# Patient Record
Sex: Male | Born: 1935 | Race: White | Hispanic: No | Marital: Married | Smoking: Former smoker
Health system: Southern US, Community
[De-identification: ages and names within clinical notes are randomized; demographics above are authoritative.]

## PROBLEM LIST (undated history)

## (undated) DIAGNOSIS — I509 Heart failure, unspecified: Secondary | ICD-10-CM

## (undated) DIAGNOSIS — I10 Essential (primary) hypertension: Secondary | ICD-10-CM

## (undated) HISTORY — PX: CERVICAL SPINE SURGERY: SHX589

---

## 1997-11-24 ENCOUNTER — Ambulatory Visit (HOSPITAL_COMMUNITY): Admission: RE | Admit: 1997-11-24 | Discharge: 1997-11-24 | Payer: Self-pay | Admitting: *Deleted

## 1997-11-28 ENCOUNTER — Ambulatory Visit (HOSPITAL_COMMUNITY): Admission: RE | Admit: 1997-11-28 | Discharge: 1997-11-28 | Payer: Self-pay | Admitting: *Deleted

## 1997-12-10 ENCOUNTER — Ambulatory Visit (HOSPITAL_COMMUNITY): Admission: RE | Admit: 1997-12-10 | Discharge: 1997-12-11 | Payer: Self-pay | Admitting: *Deleted

## 1997-12-17 ENCOUNTER — Ambulatory Visit (HOSPITAL_COMMUNITY): Admission: RE | Admit: 1997-12-17 | Discharge: 1997-12-17 | Payer: Self-pay | Admitting: *Deleted

## 1998-01-14 ENCOUNTER — Ambulatory Visit (HOSPITAL_COMMUNITY): Admission: RE | Admit: 1998-01-14 | Discharge: 1998-01-14 | Payer: Self-pay | Admitting: *Deleted

## 1998-02-10 ENCOUNTER — Encounter: Payer: Self-pay | Admitting: Emergency Medicine

## 1998-02-10 ENCOUNTER — Emergency Department (HOSPITAL_COMMUNITY): Admission: EM | Admit: 1998-02-10 | Discharge: 1998-02-10 | Payer: Self-pay | Admitting: Emergency Medicine

## 1998-08-21 ENCOUNTER — Ambulatory Visit (HOSPITAL_COMMUNITY): Admission: RE | Admit: 1998-08-21 | Discharge: 1998-08-21 | Payer: Self-pay | Admitting: *Deleted

## 1998-08-24 ENCOUNTER — Ambulatory Visit (HOSPITAL_COMMUNITY): Admission: RE | Admit: 1998-08-24 | Discharge: 1998-08-24 | Payer: Self-pay | Admitting: *Deleted

## 1998-08-24 ENCOUNTER — Encounter: Payer: Self-pay | Admitting: *Deleted

## 1998-09-19 ENCOUNTER — Ambulatory Visit (HOSPITAL_COMMUNITY): Admission: RE | Admit: 1998-09-19 | Discharge: 1998-09-19 | Payer: Self-pay | Admitting: Family Medicine

## 1998-09-19 ENCOUNTER — Encounter: Payer: Self-pay | Admitting: Family Medicine

## 1998-09-29 ENCOUNTER — Emergency Department (HOSPITAL_COMMUNITY): Admission: EM | Admit: 1998-09-29 | Discharge: 1998-09-30 | Payer: Self-pay | Admitting: Emergency Medicine

## 1998-09-29 ENCOUNTER — Encounter: Payer: Self-pay | Admitting: Emergency Medicine

## 1999-01-13 ENCOUNTER — Encounter: Payer: Self-pay | Admitting: *Deleted

## 1999-01-13 ENCOUNTER — Ambulatory Visit (HOSPITAL_COMMUNITY): Admission: RE | Admit: 1999-01-13 | Discharge: 1999-01-13 | Payer: Self-pay | Admitting: *Deleted

## 1999-01-20 ENCOUNTER — Ambulatory Visit (HOSPITAL_COMMUNITY): Admission: RE | Admit: 1999-01-20 | Discharge: 1999-01-20 | Payer: Self-pay | Admitting: *Deleted

## 1999-01-20 ENCOUNTER — Encounter: Payer: Self-pay | Admitting: *Deleted

## 1999-01-25 ENCOUNTER — Encounter: Payer: Self-pay | Admitting: *Deleted

## 1999-01-25 ENCOUNTER — Ambulatory Visit (HOSPITAL_COMMUNITY): Admission: RE | Admit: 1999-01-25 | Discharge: 1999-01-25 | Payer: Self-pay | Admitting: *Deleted

## 1999-01-29 ENCOUNTER — Encounter: Payer: Self-pay | Admitting: *Deleted

## 1999-01-29 ENCOUNTER — Ambulatory Visit (HOSPITAL_COMMUNITY): Admission: RE | Admit: 1999-01-29 | Discharge: 1999-01-29 | Payer: Self-pay | Admitting: *Deleted

## 1999-01-31 ENCOUNTER — Inpatient Hospital Stay (HOSPITAL_COMMUNITY): Admission: EM | Admit: 1999-01-31 | Discharge: 1999-02-05 | Payer: Self-pay | Admitting: Psychiatry

## 1999-05-08 ENCOUNTER — Emergency Department (HOSPITAL_COMMUNITY): Admission: EM | Admit: 1999-05-08 | Discharge: 1999-05-08 | Payer: Self-pay | Admitting: Emergency Medicine

## 1999-07-15 ENCOUNTER — Emergency Department (HOSPITAL_COMMUNITY): Admission: EM | Admit: 1999-07-15 | Discharge: 1999-07-15 | Payer: Self-pay

## 2000-07-03 ENCOUNTER — Emergency Department (HOSPITAL_COMMUNITY): Admission: EM | Admit: 2000-07-03 | Discharge: 2000-07-03 | Payer: Self-pay | Admitting: Emergency Medicine

## 2000-07-03 ENCOUNTER — Encounter: Payer: Self-pay | Admitting: Emergency Medicine

## 2001-05-21 ENCOUNTER — Encounter: Payer: Self-pay | Admitting: Urology

## 2001-05-21 ENCOUNTER — Encounter: Admission: RE | Admit: 2001-05-21 | Discharge: 2001-05-21 | Payer: Self-pay | Admitting: Urology

## 2001-07-04 ENCOUNTER — Encounter: Payer: Self-pay | Admitting: Surgery

## 2001-07-04 ENCOUNTER — Encounter (INDEPENDENT_AMBULATORY_CARE_PROVIDER_SITE_OTHER): Payer: Self-pay | Admitting: Specialist

## 2001-07-04 ENCOUNTER — Ambulatory Visit (HOSPITAL_COMMUNITY): Admission: RE | Admit: 2001-07-04 | Discharge: 2001-07-04 | Payer: Self-pay | Admitting: Surgery

## 2001-07-10 ENCOUNTER — Encounter: Payer: Self-pay | Admitting: Emergency Medicine

## 2001-07-10 ENCOUNTER — Inpatient Hospital Stay (HOSPITAL_COMMUNITY): Admission: EM | Admit: 2001-07-10 | Discharge: 2001-07-13 | Payer: Self-pay | Admitting: Emergency Medicine

## 2001-07-12 ENCOUNTER — Encounter: Payer: Self-pay | Admitting: Surgery

## 2001-09-19 ENCOUNTER — Emergency Department (HOSPITAL_COMMUNITY): Admission: EM | Admit: 2001-09-19 | Discharge: 2001-09-19 | Payer: Self-pay | Admitting: Emergency Medicine

## 2003-09-03 ENCOUNTER — Ambulatory Visit (HOSPITAL_COMMUNITY): Admission: RE | Admit: 2003-09-03 | Discharge: 2003-09-03 | Payer: Self-pay | Admitting: Internal Medicine

## 2004-07-12 ENCOUNTER — Emergency Department (HOSPITAL_COMMUNITY): Admission: EM | Admit: 2004-07-12 | Discharge: 2004-07-12 | Payer: Self-pay | Admitting: Emergency Medicine

## 2004-10-15 ENCOUNTER — Ambulatory Visit: Payer: Self-pay | Admitting: Internal Medicine

## 2005-12-16 ENCOUNTER — Emergency Department (HOSPITAL_COMMUNITY): Admission: EM | Admit: 2005-12-16 | Discharge: 2005-12-16 | Payer: Self-pay | Admitting: Emergency Medicine

## 2006-08-30 ENCOUNTER — Ambulatory Visit: Payer: Self-pay | Admitting: Internal Medicine

## 2008-02-07 ENCOUNTER — Observation Stay (HOSPITAL_COMMUNITY): Admission: EM | Admit: 2008-02-07 | Discharge: 2008-02-13 | Payer: Self-pay | Admitting: Emergency Medicine

## 2008-02-09 ENCOUNTER — Ambulatory Visit: Payer: Self-pay | Admitting: *Deleted

## 2009-08-17 ENCOUNTER — Emergency Department (HOSPITAL_COMMUNITY): Admission: EM | Admit: 2009-08-17 | Discharge: 2009-08-17 | Payer: Self-pay | Admitting: Emergency Medicine

## 2009-09-04 ENCOUNTER — Emergency Department (HOSPITAL_COMMUNITY): Admission: EM | Admit: 2009-09-04 | Discharge: 2009-09-04 | Payer: Self-pay | Admitting: Emergency Medicine

## 2009-11-20 ENCOUNTER — Emergency Department (HOSPITAL_COMMUNITY): Admission: EM | Admit: 2009-11-20 | Discharge: 2009-11-20 | Payer: Self-pay | Admitting: Emergency Medicine

## 2010-01-08 ENCOUNTER — Inpatient Hospital Stay (HOSPITAL_COMMUNITY)
Admission: EM | Admit: 2010-01-08 | Discharge: 2010-01-11 | Payer: Self-pay | Source: Home / Self Care | Admitting: Emergency Medicine

## 2010-03-31 ENCOUNTER — Emergency Department (HOSPITAL_COMMUNITY)
Admission: EM | Admit: 2010-03-31 | Discharge: 2010-04-01 | Payer: Self-pay | Source: Home / Self Care | Admitting: Emergency Medicine

## 2010-08-18 LAB — URINE CULTURE
Colony Count: >=100000
Culture  Setup Time: 201110270114

## 2010-08-18 LAB — URINE MICROSCOPIC-ADD ON

## 2010-08-18 LAB — COMPREHENSIVE METABOLIC PANEL
ALT: 22 U/L (ref 0–53)
Albumin: 3.6 g/dL (ref 3.5–5.2)
Alkaline Phosphatase: 107 U/L (ref 39–117)
CO2: 21 mEq/L (ref 19–32)
Calcium: 9.2 mg/dL (ref 8.4–10.5)
Chloride: 107 mEq/L (ref 96–112)
GFR calc Af Amer: >60 mL/min (ref >60)
Sodium: 138 mEq/L (ref 135–145)
Total Bilirubin: 1.2 mg/dL (ref 0.3–1.2)

## 2010-08-18 LAB — DIFFERENTIAL
Basophils Relative: 0 % (ref 0–1)
Eosinophils Absolute: 0.1 10*3/uL (ref 0.0–0.7)
Eosinophils Relative: 1 % (ref 0–5)
Lymphocytes Relative: 9 % — ABNORMAL LOW (ref 12–46)
Neutro Abs: 8.2 10*3/uL — ABNORMAL HIGH (ref 1.7–7.7)
Neutrophils Relative %: 84 % — ABNORMAL HIGH (ref 43–77)

## 2010-08-18 LAB — CBC
HCT: 44.4 % (ref 39.0–52.0)
Hemoglobin: 15.4 g/dL (ref 13.0–17.0)
MCHC: 34.7 g/dL (ref 30.0–36.0)
Platelets: 350 10*3/uL (ref 150–400)
WBC: 9.8 10*3/uL (ref 4.0–10.5)

## 2010-08-18 LAB — URINALYSIS, ROUTINE W REFLEX MICROSCOPIC
Glucose, UA: NEGATIVE mg/dL
Protein, ur: NEGATIVE mg/dL
pH: 6 (ref 5.0–8.0)

## 2010-08-18 LAB — RAPID URINE DRUG SCREEN, HOSP PERFORMED: Benzodiazepines: NOT DETECTED

## 2010-08-20 LAB — URINALYSIS, ROUTINE W REFLEX MICROSCOPIC
Bilirubin Urine: NEGATIVE
Ketones, ur: NEGATIVE mg/dL
Nitrite: NEGATIVE
Protein, ur: NEGATIVE mg/dL
Specific Gravity, Urine: 1.004 — ABNORMAL LOW (ref 1.005–1.030)
Urobilinogen, UA: 0.2 mg/dL (ref 0.0–1.0)

## 2010-08-20 LAB — URINE CULTURE

## 2010-08-20 LAB — DIFFERENTIAL
Basophils Absolute: 0 10*3/uL (ref 0.0–0.1)
Eosinophils Absolute: 0.1 10*3/uL (ref 0.0–0.7)
Eosinophils Relative: 2 % (ref 0–5)
Lymphocytes Relative: 18 % (ref 12–46)
Monocytes Absolute: 0.5 10*3/uL (ref 0.1–1.0)
Monocytes Absolute: 0.9 10*3/uL (ref 0.1–1.0)
Monocytes Relative: 7 % (ref 3–12)
Neutrophils Relative %: 85 % — ABNORMAL HIGH (ref 43–77)

## 2010-08-20 LAB — COMPREHENSIVE METABOLIC PANEL
ALT: 9 U/L (ref 0–53)
AST: 15 U/L (ref 0–37)
Alkaline Phosphatase: 68 U/L (ref 39–117)
CO2: 23 mEq/L (ref 19–32)
Chloride: 110 mEq/L (ref 96–112)
GFR calc Af Amer: >60 mL/min (ref >60)
GFR calc non Af Amer: >60 mL/min (ref >60)
Glucose, Bld: 113 mg/dL — ABNORMAL HIGH (ref 70–99)
Potassium: 3.3 mEq/L — ABNORMAL LOW (ref 3.5–5.1)
Sodium: 140 mEq/L (ref 135–145)
Total Bilirubin: 0.6 mg/dL (ref 0.3–1.2)

## 2010-08-20 LAB — PROTIME-INR: Prothrombin Time: 15.1 seconds (ref 11.6–15.2)

## 2010-08-20 LAB — PROCALCITONIN: Procalcitonin: <0.5 ng/mL

## 2010-08-20 LAB — RAPID URINE DRUG SCREEN, HOSP PERFORMED
Amphetamines: NOT DETECTED
Barbiturates: NOT DETECTED
Benzodiazepines: POSITIVE — AB
Opiates: NOT DETECTED
Tetrahydrocannabinol: NOT DETECTED

## 2010-08-20 LAB — CBC
HCT: 31.6 % — ABNORMAL LOW (ref 39.0–52.0)
Hemoglobin: 13.6 g/dL (ref 13.0–17.0)
MCH: 30.2 pg (ref 26.0–34.0)
MCH: 30.5 pg (ref 26.0–34.0)
MCHC: 34.9 g/dL (ref 30.0–36.0)
MCV: 87.9 fL (ref 78.0–100.0)
MCV: 88.2 fL (ref 78.0–100.0)
Platelets: 192 10*3/uL (ref 150–400)
Platelets: 193 10*3/uL (ref 150–400)
Platelets: 236 10*3/uL (ref 150–400)
RDW: 13.5 % (ref 11.5–15.5)
RDW: 13.8 % (ref 11.5–15.5)
RDW: 13.8 % (ref 11.5–15.5)

## 2010-08-20 LAB — TROPONIN I
Troponin I: 0.01 ng/mL (ref 0.00–0.06)
Troponin I: 0.02 ng/mL (ref 0.00–0.06)
Troponin I: 0.02 ng/mL (ref 0.00–0.06)

## 2010-08-20 LAB — CK TOTAL AND CKMB (NOT AT ARMC)
CK, MB: 0.5 ng/mL (ref 0.3–4.0)
Relative Index: INVALID (ref 0.0–2.5)
Relative Index: INVALID (ref 0.0–2.5)
Relative Index: INVALID (ref 0.0–2.5)
Total CK: 26 U/L (ref 7–232)
Total CK: 33 U/L (ref 7–232)

## 2010-08-20 LAB — BASIC METABOLIC PANEL
BUN: 6 mg/dL (ref 6–23)
CO2: 26 mEq/L (ref 19–32)
Calcium: 8.2 mg/dL — ABNORMAL LOW (ref 8.4–10.5)
Calcium: 8.7 mg/dL (ref 8.4–10.5)
Chloride: 105 mEq/L (ref 96–112)
Chloride: 109 mEq/L (ref 96–112)
Creatinine, Ser: 0.68 mg/dL (ref 0.4–1.5)
GFR calc Af Amer: >60 mL/min (ref >60)
GFR calc non Af Amer: >60 mL/min (ref >60)
Glucose, Bld: 118 mg/dL — ABNORMAL HIGH (ref 70–99)
Potassium: 3.7 mEq/L (ref 3.5–5.1)
Sodium: 140 mEq/L (ref 135–145)

## 2010-08-20 LAB — MAGNESIUM: Magnesium: 1.6 mg/dL (ref 1.5–2.5)

## 2010-08-20 LAB — BLOOD GAS, ARTERIAL
Bicarbonate: 26.3 mEq/L — ABNORMAL HIGH (ref 20.0–24.0)
Drawn by: 257701
O2 Content: 2 L/min
Patient temperature: 98.6
pH, Arterial: 7.426 (ref 7.350–7.450)
pO2, Arterial: 73.5 mmHg — ABNORMAL LOW (ref 80.0–100.0)

## 2010-08-20 LAB — GLUCOSE, CAPILLARY: Glucose-Capillary: 99 mg/dL (ref 70–99)

## 2010-08-20 LAB — CULTURE, BLOOD (ROUTINE X 2): Culture: NO GROWTH

## 2010-08-23 LAB — URINALYSIS, ROUTINE W REFLEX MICROSCOPIC
Glucose, UA: NEGATIVE mg/dL
Glucose, UA: NEGATIVE mg/dL
Nitrite: NEGATIVE
Protein, ur: 100 mg/dL — AB
Specific Gravity, Urine: 1.026 (ref 1.005–1.030)
Urobilinogen, UA: 0.2 mg/dL (ref 0.0–1.0)
pH: 5.5 (ref 5.0–8.0)

## 2010-08-23 LAB — COMPREHENSIVE METABOLIC PANEL
ALT: 13 U/L (ref 0–53)
AST: 20 U/L (ref 0–37)
Albumin: 4 g/dL (ref 3.5–5.2)
Alkaline Phosphatase: 86 U/L (ref 39–117)
BUN: 10 mg/dL (ref 6–23)
Chloride: 104 mEq/L (ref 96–112)
GFR calc Af Amer: >60 mL/min (ref >60)
Potassium: 3 mEq/L — ABNORMAL LOW (ref 3.5–5.1)
Sodium: 140 mEq/L (ref 135–145)
Total Bilirubin: 1 mg/dL (ref 0.3–1.2)
Total Protein: 7.3 g/dL (ref 6.0–8.3)

## 2010-08-23 LAB — DIFFERENTIAL
Basophils Relative: 0 % (ref 0–1)
Eosinophils Relative: 1 % (ref 0–5)
Monocytes Absolute: 0.6 10*3/uL (ref 0.1–1.0)
Monocytes Relative: 6 % (ref 3–12)
Neutro Abs: 6.9 10*3/uL (ref 1.7–7.7)

## 2010-08-23 LAB — URINE CULTURE
Colony Count: NO GROWTH
Colony Count: NO GROWTH
Culture: NO GROWTH
Culture: NO GROWTH

## 2010-08-23 LAB — URINE MICROSCOPIC-ADD ON

## 2010-08-23 LAB — CBC
Platelets: 276 10*3/uL (ref 150–400)
RDW: 13.7 % (ref 11.5–15.5)
WBC: 8.6 10*3/uL (ref 4.0–10.5)

## 2010-08-25 LAB — BASIC METABOLIC PANEL
CO2: 28 mEq/L (ref 19–32)
Calcium: 8.8 mg/dL (ref 8.4–10.5)
Creatinine, Ser: 0.85 mg/dL (ref 0.4–1.5)
GFR calc Af Amer: >60 mL/min (ref >60)
GFR calc non Af Amer: >60 mL/min (ref >60)
Glucose, Bld: 110 mg/dL — ABNORMAL HIGH (ref 70–99)
Sodium: 142 mEq/L (ref 135–145)

## 2010-08-25 LAB — DIFFERENTIAL
Basophils Absolute: 0 10*3/uL (ref 0.0–0.1)
Basophils Relative: 0 % (ref 0–1)
Lymphocytes Relative: 17 % (ref 12–46)
Monocytes Absolute: 0.6 10*3/uL (ref 0.1–1.0)
Neutro Abs: 6.4 10*3/uL (ref 1.7–7.7)
Neutrophils Relative %: 75 % (ref 43–77)

## 2010-08-25 LAB — CBC
Hemoglobin: 12.9 g/dL — ABNORMAL LOW (ref 13.0–17.0)
MCHC: 33.8 g/dL (ref 30.0–36.0)
RDW: 14.7 % (ref 11.5–15.5)

## 2010-10-19 NOTE — H&P (Signed)
NAME:  Eric Evans, Eric Evans NO.:  [id number]   MEDICAL RECORD NO.:  [id number]          PATIENT TYPE:  EMS   LOCATION:  MAJO                         FACILITY:  [facility]   PHYSICIAN:  Vania Rea, M.D. DATE OF BIRTH:  11-20-1934   DATE OF ADMISSION:  02/07/2008  DATE OF DISCHARGE:                              HISTORY & PHYSICAL   PRIMARY CARE PHYSICIAN:  [facility] [place]   CHIEF COMPLAINT:  Seizure and diarrhea.   HISTORY OF PRESENT ILLNESS:  This is a 76 year old Caucasian male who  was brought in by his wife with a complaint of sudden onset of seizure  associated with incontinence of stool and postictal drowsiness.  The  patient's wife now left and is unavailable to collaborate or verify this  history.  The patient, himself, is now wide awake and alert and denies  seizure activities.  Says he has been having diarrhea.  He has had four  loose diarrheal stools, and he was incontinent of stool because he could  not get to the bathroom sufficiently quickly.  However, it has been  noted that the patient has an inappropriate affect, and review of his  visit to the pulmonologist in 2008, note is made of this inappropriate  affect, and it is not clear how reliable this patient's history is.   The patient was, apparently for a long time, on chronic pain medications  including methadone, diazepam, nortriptyline and he reports that he was  weaned off these medications over a period of about 8 months and just  suddenly stopped taking nortriptyline about a week ago.  The ER  physician's note reports seizure of short onset and no preceding factors  at postictal confusion.  The patient says he was on his way to the  bathroom because he could feel that he wanted to pass stool, but the  stool came down on him too rapidly.  He denies any fever, chest pain,  shortness of breath, nausea or vomiting.  He denies any blood in the  stool.   PAST MEDICAL HISTORY:  1. CHF.  2. Pneumonia.  3. Prostatic hypertrophy.  4. History of chronic pain.  5. Per the pulmonary notes, he has a history of asbestosis and      disorder of affect.   PAST SURGICAL HISTORY:  1. Cervical disk disease.  2. Cholecystectomy in 2002.   MEDICATIONS:  1. Lisinopril 10 mg daily.  2. Trazodone 25 mg at bedtime.  3. Venlafaxine 50 mg daily.  4. Simvastatin 20 mg at bedtime.   ALLERGIES:  BACLOFEN.   SOCIAL HISTORY:  Denies tobacco, alcohol or illicit drug use within the  past 25 years.  He is married.   FAMILY HISTORY:  Denies any family history of any medical problems.   REVIEW OF SYSTEMS:  The patient denies any symptoms other than noted  above on a 10-point review of systems.   PHYSICAL EXAMINATION:  GENERAL:  Elderly, Caucasian man lying in the  stretcher, smiling and in no distress.  VITAL SIGNS:  Temperature 98.2, pulse 88, respirations 20, blood  pressure 159/86.  HEENT:  Pupils are round and equal, mucous membranes are pink.  Anicteric.  CHEST:  Chest is clear to auscultation bilaterally.  CARDIOVASCULAR:  Regular rhythm without murmur.  ABDOMEN:  Obese, soft and nontender.  EXTREMITIES:  Without edema.  He has 2+ pulses bilaterally.  CNS:  Cranial nerves II-XII grossly intact.  No focal neurological  deficits.   LABORATORY DATA:  White count is elevated at 11.3, hemoglobin is  appropriate at 14.5, platelets 371, absolute monocyte count is 9.4.  Serum chemistries:  Sodium 141, potassium critically low at 2.6, glucose  117, BUN 8, creatinine 0.74.  Otherwise, chemistry is unremarkable.  Salicylate, acetaminophen and alcohol levels are undetectable.  CT of  his brain is unremarkable.   ASSESSMENT:  1. History of new onset seizure in a gentleman recently being weaned      from psychotropics.  2. History of one day of persistent diarrhea with associated      hypokalemia.   PLAN:  1. We will admit this gentleman for hydration and repletion of his       electrolytes and monitor him for any further seizure activity.      Will attempt to get in touch with his wife and get a clear history.  2. History of hyperlipidemia.  3. History of benign prostatic hypertrophy.  4. History of congestive heart failure.  5. History of chronic pain.  6. History of hypertension.   We will treat all conditions symptomatically.      Vania Rea, M.D.  Electronically Signed     LC/MEDQ  D:  02/07/2008  T:  02/07/2008  Job:  [id number]   cc:   [facility], [place]

## 2010-10-19 NOTE — Discharge Summary (Signed)
NAME:  Eric Evans, Eric Evans              ACCOUNT NO.:  [id number]   MEDICAL RECORD NO.:  [id number]          PATIENT TYPE:  OBV   LOCATION:  5502                         FACILITY:  [facility]   PHYSICIAN:  Lonia Blood, M.D.       DATE OF BIRTH:  08/10/1934   DATE OF ADMISSION:  02/06/2008  DATE OF DISCHARGE:  02/12/2008                               DISCHARGE SUMMARY   PRIMARY CARE PHYSICIAN:  This patient goes to the [facility].   DISCHARGE DIAGNOSES:  1. Delirium.  2. Probable dementia.  3. Probable seizure disorders.  4. Reported history of congestive heart failure but without any      symptoms to confirm.  5. Hypertension.  6. Depression.  7. Hypertension.  8. Benign prostatic hypertrophy.   DISCHARGE MEDICATIONS:  1. Keppra 500 mg by mouth twice a day.  2. Lisinopril 10 mg daily.  3. Trazodone 25 mg at bedtime.  4. Effexor XR 75 mg in the morning.  5. Simvastatin 20 mg at bedtime.   CONDITION ON DISCHARGE:  Eric Evans is transferred to skilled nursing  facility for rehabilitation.  The patient will follow up with his  psychiatrist at [facility] as previously scheduled.  The patient has been scheduled to follow up with Dr. Kelli Hope  from [facility] on March 20, 2008, at 1:00 p.m..  The skilled nursing home must provide transportation if the patient is  still in the nursing home at the time of the appointment.   CONSULTATIONS DURING THIS ADMISSION:  This patient was seen in  consultation by Dr. Kelli Hope from Neurology and Dr. Milford Cage from Psychiatry.   PROCEDURES DURING THIS ADMISSION:  1. On February 07, 2008, the patient underwent an EEG with findings of      focal slowing over the right frontal areas suggestive of underlying      focal pathology, paroxysmal spike activity from the right frontal      area suggestive of underlying cortical irritability, potential left  epileptogenicity.  2. On February 08, 2008, MRI of the brain.  Findings of mild      nonspecific cerebral white matter signal abnormality with the      cerebral volume being in the lower limits of normal for this age.   HISTORY AND PHYSICAL:  Refer to the dictated H&P done by Dr. Vania Rea on February 07, 2008.   HOSPITAL COURSE:  Altered mental status.  Eric Evans was brought in by  his family because of episodic moments of bizarre behavior.  They were  concerned for possibility of a stroke but after a CT scan of the brain,  MRI of the brain, and a complete neurological examination, we were able  to reassure them that this patient did not sustain a stroke.  Mr.  Evans was seen by the consultant neurologist, Dr. Kelli Hope on  February 07, 2008 and it was felt that he was probably suffering from  dementia with possible frontal lobe dementia with probable seizures as  well.  Eric Evans was started on Keppra,  and he had an initial brief  improvement.  On February 09, 2008, though he was continuing to have  very bizarre behaviors with frequent spitting, pill-rolling movements,  and with inappropriate verbal bursts.  At that point in time, I have  increased his Keppra upon the recommendation of Dr. Pearlean Brownie.  Upon  increasing the Keppra, the patient's bizarre behavior subsided and by  the time the consultant psychiatrist has seen him, they felt that he was  suffering from mild depression and everything else was neurological.  Eric Evans remained fairly stable and fairly cooperative.  He will  need ongoing mini-mental status exams and screenings in the skilled  nursing home as well as after discharge.  His mini mental score at the  time of the initial neurological exam is 14, but I feel like he can do  better right now as he is alert and oriented to place, person, and time.  During this admission, Eric Evans has been examined by physical  therapy and occupational  therapy.  Due to his prior cervical spine  surgeries and chronic C-spine disease, the patient has significant gait  abnormality.  He will require 24-hour supervision as well as intensive  physical therapy, occupation therapy, and rehab in a local nursing home.  Plans are to discharge him to a facility pending bed availability.  The  patient will follow up with Dr. Thad Ranger from Neurology and with his  psychiatrist.      Lonia Blood, M.D.  Electronically Signed     SL/MEDQ  D:  02/12/2008  T:  02/12/2008  Job:  [id number]

## 2010-10-19 NOTE — Discharge Summary (Signed)
NAME:  KEIRON, IODICE              ACCOUNT NO.:  [id number]   MEDICAL RECORD NO.:  [id number]          PATIENT TYPE:  OBV   LOCATION:  5502                         FACILITY:  [facility]   PHYSICIAN:  Theodosia Paling, MD    DATE OF BIRTH:  1934/07/03   DATE OF ADMISSION:  02/06/2008  DATE OF DISCHARGE:  02/13/2008                               DISCHARGE SUMMARY   ADDENDUM   The patient was reevaluated by the physical therapist, sent by insurance  company, who evaluated the patient and says that he is at his baseline  and does not need skilled nursing facility.  The patient will be going  home with the home Physical Therapy and Occupational Therapy, and he  will have an in-home social work evaluation.  The patient's wife is  requesting if she can use some diazepam for anxiety at bedtime and  whether it will have any interaction with Keppra.  I have told the  patient that small doses of diazepam in the range of 2.5-5 mg at bedtime  is fine.  He usually takes 10 mg, but has weaned himself off for the  last 6 months.  I confirmed with the neurologist that it is okay to use  diazepam and the Keppra together.  Dr. Cecilio Asper at [facility] will be  following up the patient as an outpatient.      Theodosia Paling, MD  Electronically Signed     NP/MEDQ  D:  02/13/2008  T:  02/14/2008  Job:  [id number]   cc:   Dr. Cecilio Asper

## 2010-10-19 NOTE — Consult Note (Signed)
NAME:  Eric Evans, Eric Evans NO.:  [id number]   MEDICAL RECORD NO.:  [id number]          PATIENT TYPE:  OBV   LOCATION:  5502                         FACILITY:  [facility]   PHYSICIAN:  Jasmine Pang, M.D. DATE OF BIRTH:  [birth year]   DATE OF CONSULTATION:  02/10/2008  DATE OF DISCHARGE:                                 CONSULTATION   PRIORITY PSYCHIATRIC CONSULTATION   This is a 76 year old white male, who was brought to the hospital due to  suspicion of having a seizure associated with stool incontinence and  postictal states.  The patient reportedly uses beers and has been a long  time abuser of methadone and diazepam.  He stopped his meds over the  past several months.  He had also been on nortriptyline and apparently  recently stopped this.  The patient was noted to have difficulty with  attention and concentration, conversing and recollection.  His wife  noted he was having a lot of mood swings with highs and lows.  He was  having decreased sleep and a major personality change.  Neurology was  consulted and felt that there was a possible frontotemporal dementia.  MRI was negative for acute intracranial abnormality.  He was noted to  have a vitamin B12 deficiency and was getting repletion of this at the  time I saw him.  On February 09, 2008, nursing reported symptoms of  deliria with mood swings, the patient was seeing bugs on the ceiling, he  was spitting, he was pill-rolling, he was doing inappropriate things.  His wife was very concerned and stated this was not like her husband at  all.  Neuro suggested increasing the Keppra and he was doing much better  on this.  By February 10, 2008, the MD noted that there was improvement  in behavior.  There were no further bizarre behaviors.  Patient states  he sees Dr. __________for treatment of depression.  He has been on  nortriptyline for 11 years.  He is currently on Effexor-XR 75 mg q.a.m.   MENTAL STATUS EXAM:  The  patient was friendly and cooperative with fair  eye contact.  Speech was soft and slow.  Psychomotor activity was  retarded.  Patient's mood, he denied depression or anxiety.  There did  appear to be some dysphoria.  Affect was constricted.  There was no  suicidal or homicidal ideation, no thoughts of self-injurious behavior,  no auditory or visual hallucinations.  There was no noted paranoia.  There did appear to be some delusional ideation.  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  He was focused on how to tell when [place] comes.  Other than this, there was nothing bizarre in his conversation.  On  cognitive exam, attention and concentration were fairly good today.  He  was alert and oriented x3.  He could not remember the date but did know  it was in September 2009.  Short-term memory, he was able to remember my  name 5 minutes after I introduced myself to him without showing him my  name tag.  He also was able  to remember events that have occurred since  he has been in the hospital.  Long-term memory was intact when it came  to discussing his military service.  He apparently is service-connected  and served in [place] when the wall went up.   ASSESSMENT:  1. Recent delirium, which is now resolved.  2. Possible underlying dementia, though his thinking appears to be      clearer today.   RECOMMENDATIONS:  1. No change in treatment plan for now.  2. Neuro to pursue the evaluation of dementia.  3. Continue Effexor-XR 75 mg p.o. daily.  4. Haldol 2 mg IM q.4 to 6 hours would be beneficial if the delirium      returned.  5. Continue Trazodone for sleep.  6. Call Dr. Jeanie Sewer if mental status worsens.   Thank you very much for this consult.      Jasmine Pang, M.D.  Electronically Signed     BHS/MEDQ  D:  02/10/2008  T:  02/10/2008  Job:  [id number]

## 2010-10-19 NOTE — Procedures (Signed)
EEG NUMBER:  [id number]   CLINICAL HISTORY:  This is a portable EEG done at the bedside.  The  patient is described as awake and asleep.  This is a 76 year old man  brought in with altered mental status and possible seizure.  EEG is  performed for evaluation.   DESCRIPTION:  Dominant rhythm in this tracing is seen in fragments in  the early part of the record, which is made when awake.  This appears to  be a 9-10 Hz alpha rhythm, which predominates posteriorly and appears  without abnormal symmetry.  However, intermixed with this is a diffuse 7-  8 Hz theta rhythm, which also appears diffusely without abnormal  asymmetry.  Throughout the early part of the recording, there is a  fairly persistent focal slowing of 3-4 Hz over the left frontal area.  As this progresses, intermittent spike activity is seen emanating from  the right frontal area, centered around the F4 electrode.  This remains  fairly persistent for several minutes but does not ever become organized  seizure activity either clinically or electrographically.  On a couple  of occasions, these spikes were associated with twitching movements on  the left side.  Later in the recording, sleep occurs as evidenced by the  appearance of vertex waves and K complexes.  Some intermittent right  focal slowing persistent sleep although surprisingly the spike activity  disappears.  Single channel devoted EKG revealed sinus rhythm  throughout, rate approximately 66 beats per minute.   CONCLUSION:  Abnormal study due to the presence of;  1. Intermittent generalized slowing of the background rhythms.  2. Focal slowing over the right frontal area, a finding suggestive of      underlying focal pathology.  3. Paroxysmal spike activity emanating for several minutes from the      right frontal area, a finding suggestive of underlying cortical      irritability and potential left epileptogenicity.  Correlation with      exam and imaging screen is  recommended.      Michael L. Thad Ranger, M.D.  Electronically Signed     ZOX:WRUE  D:  02/07/2008 16:31:16  T:  02/08/2008 05:49:26  Job #:  [id number]

## 2010-10-19 NOTE — Consult Note (Signed)
NAME:  Eric Evans, Eric Evans              ACCOUNT NO.:  [id number]   MEDICAL RECORD NO.:  [id number]          PATIENT TYPE:  OBV   LOCATION:                               FACILITY:  [facility]   PHYSICIAN:  Casimiro Needle L. Reynolds, M.D.DATE OF BIRTH:  March 03, 1935   DATE OF CONSULTATION:  02/13/2008  DATE OF DISCHARGE:                                 CONSULTATION   __________   REASON FOR CONSULTATION:  Change in mental status.   HISTORY OF PRESENT ILLNESS:  This is a 76 year old male who is brought  to the hospital secondary to the wife complaining that the husband  possibly had a seizure in which he had stool incontinence and a  postictal state.  The seizure was described as the patient standing in  the stairway holding onto the door frame, staring without speaking or  acknowledging individuals around him.  The patient, at that time, walked  to his chair and, as the wife states, went into a somewhat postictal  state and fell asleep.  The patient has a long history of use of  methadone, diazepam, and Pamelor.  However, he stopped these medications  approximately 8 months ago.  The patient stopped his medications under  the direction of his medical doctor.  According to wife, much of his odd  behavior in the past has been attributed to the massive amounts of  medications that he has been on.  However, since he has titrated himself  off of the medications, they have noticed that none of his abnormal  behaviors, such as drifting off, inattention, inability to concentrate,  abnormal twitching, perseveration, and abuliac personality has not  changed.  During my interview with this patient, the patient had a  difficult time with attentiveness, conversations, recollection.  When he  did recall past events or present events, he was often not correct in  the time and/or context.  The patient has had significant problems both  interview and prior to interview with memory, acalculia, concentration,  movement,  and mood disorders, with significant highs and significant  lows.  The wife states that he is having a significantly decreased  amount of sleep at night, and that has made a major change in  personality.   PAST SURGICAL HISTORY:  Cervical disk disease, cholecystectomy.   PAST MEDICAL HISTORY:  Hypertension, congestive heart failure,  pneumonia, benign prostatic hypertrophy, chronic pain, history of  asbestosis, esophageal diverticulum, anemia, depression.   MEDICATIONS:  1. The patient is on lisinopril 10 mg.  2. Trazodone 25 mg.  3. Venlafaxine 50 mg daily.  4. Simvastatin.   ALLERGIES:  PAXIL and DILAUDID.   SOCIAL HISTORY:  The patient does not smoke, does not drink, does not do  any illicit drugs.   REVIEW OF SYSTEMS:  All 12 review of systems were thoroughly covered.  The patient was positive for light-headedness, constipation, joint pain,  stiffness, arthritis, tremor, dementia.   PHYSICAL EXAM:  VITAL SIGNS:  Blood pressure 140/70, pulse 88,  respiratory rate 18, temperature 99.1.  MENTAL STATUS:  The patient is alert to place, year, but not to month,  president, county, specific day.  CRANIAL NERVES:  Pupils are equal, round, and reactive to light and  accommodation.  Conjugate gaze.  Extraocular muscles intact.  Visual  fields were unable to be obtained secondary to the patient staring off  and not paying attention.  Face is symmetrical.  Tongue midline.  Uvula  midline.  Question of aphasia as the patient was at times able to  express himself.  At other times, it seemed as though he was trying to  express himself, but could not find the words.  SENSATION:  V1 through V3 are full.  Shoulder shrug is full.  Sensation  was hard to obtain secondary to the patient repeating what was asked,  such as, if asked if something felt the same, he would say yes if it was  done the same way.  If asked if the 2 sensations felt the same, he would  say yes.  If asked if these 2  sensations felt differently, even though  it was the same sensation, he would say they did feel differently.  So,  it was very hard to ascertain what was true and what was not.  Grossly,  however, given this, assessment showed gross light touch, palpation,  vibration, were equal bilaterally.  COORDINATION:  Finger to nose, heel to shin, fine motor movements  normal.  Gait was not tested.  MOTOR:  All movements were very slow.  His strength was 5/5 throughout,  but intermittently, he would have a tremor on bilateral arms.  The  patient had no rigidity, no cogwheeling, no lead pipe cogwheeling or  rigidity.  The patient would have a slight tremor when he held  bilaterally arms forward.  He had difficulty with finger to nose, when  asked to place his right finger on his left nose, he put his right  finger on his right nose.  He would often show left-sided mild apraxia.  Again, this would wax and wane.  The patient had no drift at this time.   LABS:  Magnesium 2.2, sodium 140, potassium 3.5, chloride 104, carbon  dioxide 26, BUN 17, creatinine 0.94, glucose 108.  Glomerular filtration  rate greater than 60.  White blood cell count 9.7, hemoglobin and  hematocrit 13.6 and 40.1.  Platelets 315,000.   IMAGING AND TESTS:  No imaging and tests were obtained at this time.   ASSESSMENT:  This is a 76 year old male with abulia and possible  frontotemporal dementia.  The patient does not have an MRI at this time,  which has been ordered, and TSH was normal at the [facility], but has  been reordered at this time.  We will order a B12, RPR, VDRL, TSH, MRI  of head, multivitamin along with trazodone with sleep.  Will follow up  with the patient in house.  We thank you very much for this  consultation.     ______________________________  Felicie Morn, PA-C      Marolyn Hammock. Thad Ranger, M.D.  Electronically Signed    DS/MEDQ  D:  02/07/2008  T:  02/07/2008  Job:  [id number]   cc:   [employer] Team  D

## 2010-10-22 NOTE — Assessment & Plan Note (Signed)
[facility]                             PULMONARY OFFICE NOTE   NAME:Eric Evans, Eric Evans                     MRN:          [id number]  DATE:08/30/2006                            DOB:          1934/09/04    PULMONARY EXTENDED FOLLOWUP OFFICE VISIT   HISTORY:  A 75 year old white male who gets most of his care through the  [place] and has not been seen here in almost 2 years with chest x-ray  suggesting pleural thickening consistent with asbestosis and COPD,  status post smoking cessation in 1990.  He was supposed to return for  PFTs when I last saw him, but never came back and comes in complaining  of increasing cough for the last year, especially at bedtime, associated  with overt reflux symptoms that have been especially worse over the last  month.  He denies any excessive sputum production, fever, chills,  sweats, orthopnea, PND, leg swelling.   Medications reviewed in detail in the column dated August 30, 2006 does  not include any pulmonary medicines at all, but do include high doses of  methadone.   PHYSICAL EXAMINATION:  He is a chronically ill-appearing white male with  a very inappropriate affect and attitude with his wife interjecting most  of the history.  He has stable vital signs.  HEENT:  Is unremarkable.  Pharynx clear.  LUNGS:  Fields reveal a few inspiratory crackles and minimal expiratory  rhonchi, but no expiratory wheeze or cough.  There is a regular rate and rhythm without murmur, gallop or rub.  ABDOMEN:  Soft but obese, benign.  EXTREMITIES:  Warm without calf tenderness, cyanosis or clubbing.   IMPRESSION:  1. Nocturnal cough, minimally productive of scant yellow sputum which      does not respond to antibiotic therapy and is not associated with      any hemoptysis or pleuritic or exertional chest pain, orthopnea,      PND or leg swelling or unattended weight loss.  Is probably not      related to the underlying issue of  asbestosis.  I recommended a      trial of  Zegerid 40 mg at bedtime.  2. I note that the patient identified that he is on a new blood      pressure medicine for the last month and that his problem has been      worse over the last month.  I suspect that it is an angiotension-      converting enzyme inhibitor, especially since he got it through the      [place], where they do not use angiotensin-receptor blockers.  If the      cough and reflux symptoms do not quickly respond to Zegerid, I      would consider replacing the angiotension-converting enzyme      inhibitor (if that is what he takes and explained this carefully to      his wife).  3. It will be very difficult to work with this patient based on his      affect and attitude.  He appears  to have significant psychological      issues, but is under the care of the [place] physicians in [place] for      this.  4. Finally, the issue of asbestosis needs to be followed. I repeated a      chest x-ray today to      compare to previous studies, which is pending at the time of      dictation.  However, if his      symptoms do not resolve back to baseline, a sinus and a chest CT      scan need to be considered in the setting of a new chronic cough.     Charlaine Dalton. Sherene Sires, MD, [facility]  Electronically Signed    MBW/MedQ  DD: 08/31/2006  DT: 08/31/2006  Job #: [id number]

## 2010-10-22 NOTE — Op Note (Signed)
[facility]  Patient:    Eric Evans, MCMANAMAN Visit Number: [id number] MRN: [id number]          Service Type: OUT Location: DAY Attending Physician:  Bonnetta Barry Dictated by:   Velora Heckler, M.D. Proc. Date: 07/12/01 Admit Date:  07/04/2001 Discharge Date: 07/04/2001   CC:         Excell Seltzer. Annabell Howells, M.D.  Pasty Spillers. Verlon Au., M.D.   Operative Report  PREOPERATIVE DIAGNOSES:  1. Symptomatic cholelithiasis.  2. Chronic cholecystitis.  POSTOPERATIVE DIAGNOSES:  1. Symptomatic cholelithiasis.  2. Chronic cholecystitis.  PROCEDURE:  Laparoscopic cholecystectomy with intraoperative cholangiography.  SURGEON:  Velora Heckler, M.D.  ASSISTANT:  Angelia Mould. Derrell Lolling, M.D.  ANESTHESIA:  General.  ESTIMATED BLOOD LOSS:  Minimal.  PREPARATION:  Betadine.  COMPLICATIONS:  None.  INDICATIONS FOR PROCEDURE:  The patient is a 76 year old white male who presents with symptomatic cholelithiasis. The patient has had symptoms for a number of years which he had attributed to indigestion. This has become progressively more symptomatic. He is taking ranitidine 300 mg twice daily without significant symptomatic improvement. The patient was seen by Dr. Bjorn Pippin. He underwent ultrasound of the abdomen at [facility] which showed a small contracted gallbladder with multiple gallstones. The patient had mild right upper quadrant tenderness. He was scheduled for elective cholecystectomy. The patient presented on July 10, 2001 to the [facility] Emergency Department with complaints of nausea, vomiting, and abdominal pain. White blood cell count was elevated at 13,000. The patient was admitted and started on intravenous antibiotics. He clinically improved and was prepared for the operating room today.  DESCRIPTION OF PROCEDURE:  The procedure was done in OR #1 at the [facility]. The patient was brought to the operating room, placed in  a supine position in the operating room table. Following the administration of general anesthesia, the patient was prepped and draped in the usual strict aseptic fashion. After ascertaining that an adequate level of anesthesia had been obtained, an infraumbilical incision was made with a #15 blade. Dissection was carried down through subcutaneous tissues. The fascia was incised in the midline and the peritoneal cavity is entered cautiously. An #0 Vicryl pursestring suture is placed in the fascia. A Hasson cannula is introduced under direct vision and secured with the pursestring suture. The abdomen is insufflated with carbon dioxide. The laparoscope was introduce    d under direct vision and the abdomen explored. Operative ports are placed along the right costal margin in the midline, mid clavicular line, and anterior axillary line. The fundus of the gallbladder is grasped and retracted cephalad. There are both omental adhesions and duodenal adhesions to the undersurface of the gallbladder. These are taken down carefully with blunt dissection and hemostasis obtained with the electrocautery. Dissection is begun at the neck of the gallbladder. The cystic duct is dissected out along its length. The gallbladder is inadvertently punctured with the retracting clamps. This allows for spillage of some moderate size gallstones which are retrieved with the stone forceps included with the specimen. The cystic duct is dissected out along its length and a clip is placed at the neck of the gallbladder. The cystic duct is incised. A Cook cholangiography catheter is introduced through the abdominal wall into the right upper quadrant and inserted without difficulty under the cystic duct. It is secured with a LigaClip. Using C-arm fluoroscopy real-time cholangiography is performed. With injection of contrast, there is rapid filling of a fairly long cystic  duct. There is a normal caliber common bile duct.  Contrast refluxes into the right and left biliary systems. Distally the contrast flows through the ampulla into the duodenum without filling defect or obstruction. The catheter is withdrawn. The cystic duct is triply clipped and divided. The cystic artery is dissected out along its length, triply clipped, and divided. The gallbladder is then excised from the gallbladder bed using the hook electrocautery for hemostasis. Following excision, the gallbladder is placed into an endocatch bag. It is withdrawn through the umbilical port without difficulty. The right upper quadrant is copiously irrigated with warm saline. One further gallstone is identified and retrieved with the stone forceps. The right upper quadrant is then copiously irrigated with warm saline which is evacuated. Good hemostasis is noted. No evidence of bile leak is noted. No further stones are identified. The ports are removed under direct vision, pneumoperitoneum released. An #0 Vicryl pursestring suture is tied securely at the umbilicus. All wounds are anesthetized with local anesthetic. All wounds are closed with interrupted 4-0 Vicryl subcuticular sutures. The wounds were washed and dried and Benzoin and Steri-Strips are applied. Sterile gauze dressings are applied. The patient is awakened from anesthesia and brought to the recovery room in stable condition. The patient tolerated the procedure well. Dictated by:   Velora Heckler, M.D. Attending Physician:  Bonnetta Barry DD:  07/12/01 TD:  07/13/01 Job: [id number]

## 2010-10-22 NOTE — Discharge Summary (Signed)
[place]  Patient:    Eric Evans, Eric Evans Visit Number: [id number] MRN: [id number]          Service Type: MED Location: [phone] Attending Physician:  Bonnetta Barry Dictated by:   Velora Heckler, M.D. Admit Date:  07/10/2001 Discharge Date: 07/13/2001   CC:         Pasty Spillers. Verlon Au., M.D.   Discharge Summary  REASON FOR ADMISSION:  Acute cholecystitis.  HISTORY OF PRESENT ILLNESS:  The patient is a 76 year old white male who presented at the request of Excell Seltzer. Annabell Howells, M.D., for symptomatic cholelithiasis.  Tp had noted symptoms for a number of years.  These have become progressively worse.  Ultrasound of the abdomen showed a small contracted gallbladder containing multiple gallstones.  The patient was referred to general surgery and prepared for cholecystectomy.  HOSPITAL COURSE:  The patient was admitted through the emergency department on July 10, 2001, by Vikki Ports, M.D.  The patient was found to have abdominal tenderness and an elevated white blood count associated with nausea and vomiting.  The patient was admitted and treated with intravenous antibiotics.  White blood cell count rapidly returned to normal.  Abdomen became soft.  The patient was continued on antibiotics for 36 hours and taken to the operating room on July 12, 2001, where he underwent laparoscopic cholecystectomy with intraoperative cholangiography.  Postoperative course was straightforward.  The patients diet was advanced from clear liquids to a regular diet on the first postoperative day.  He tolerated this easily.  He was having normal urinary function.  After discussion with his caretaker at home, a decision was made to discharge home later in the day on February 7.  DISCHARGE PLANNING:  The patient was discharged home July 13, 2001, in good condition, tolerating a regular diet, and ambulating independently.  The patient will be seen  back in my office at [facility] in two to three weeks.  DISCHARGE MEDICATIONS:  Vicodin as needed for pain.  DISCHARGE INSTRUCTIONS:  Usual instructions for wound care are given.  FINAL DIAGNOSIS:  Chronic cholecystitis and cholelithiasis.  CONDITION AT DISCHARGE:  Improved. Dictated by:   Velora Heckler, M.D. Attending Physician:  Bonnetta Barry DD:  07/24/01 TD:  07/24/01 Job: [id number]

## 2011-03-09 LAB — ACETAMINOPHEN LEVEL: Acetaminophen (Tylenol), Serum: <10 — ABNORMAL LOW

## 2011-03-09 LAB — CBC
Hemoglobin: 13.6
Hemoglobin: 13.6
MCHC: 34.1
MCV: 85.2
Platelets: 307
Platelets: 371
RBC: 4.65
RBC: 4.67
RDW: 15.3
RDW: 15.4
RDW: 15.7 — ABNORMAL HIGH
WBC: 9.7

## 2011-03-09 LAB — BASIC METABOLIC PANEL
BUN: 13
Calcium: 9.1
Calcium: 9.3
Calcium: 9.7
Chloride: 104
Creatinine, Ser: 0.94
GFR calc Af Amer: >60
GFR calc Af Amer: >60
GFR calc non Af Amer: >60
GFR calc non Af Amer: >60
Glucose, Bld: 92
Sodium: 140
Sodium: 143

## 2011-03-09 LAB — COMPREHENSIVE METABOLIC PANEL
ALT: 47
AST: 35
CO2: 31
Calcium: 9.4
Creatinine, Ser: 0.94
GFR calc Af Amer: >60
GFR calc non Af Amer: >60
Sodium: 141
Total Protein: 8.2

## 2011-03-09 LAB — DIFFERENTIAL
Eosinophils Relative: 1
Lymphocytes Relative: 8 — ABNORMAL LOW
Lymphs Abs: 0.9
Monocytes Relative: 8

## 2011-03-09 LAB — GLUCOSE, CAPILLARY: Glucose-Capillary: 112 — ABNORMAL HIGH

## 2011-03-09 LAB — IRON AND TIBC
Saturation Ratios: 26
TIBC: 279

## 2011-03-09 LAB — POCT CARDIAC MARKERS
Myoglobin, poc: 153
Troponin i, poc: <0.05

## 2011-03-09 LAB — MAGNESIUM
Magnesium: 2.2
Magnesium: 2.2

## 2011-03-09 LAB — SALICYLATE LEVEL: Salicylate Lvl: <4

## 2011-03-09 LAB — VITAMIN B12: Vitamin B-12: 187 — ABNORMAL LOW (ref 211–911)

## 2011-03-09 LAB — RPR: RPR Ser Ql: NONREACTIVE

## 2015-08-13 DIAGNOSIS — M79671 Pain in right foot: Secondary | ICD-10-CM | POA: Diagnosis not present

## 2015-08-13 DIAGNOSIS — J209 Acute bronchitis, unspecified: Secondary | ICD-10-CM | POA: Diagnosis not present

## 2015-08-13 DIAGNOSIS — I10 Essential (primary) hypertension: Secondary | ICD-10-CM | POA: Diagnosis not present

## 2015-08-13 DIAGNOSIS — R609 Edema, unspecified: Secondary | ICD-10-CM | POA: Diagnosis not present

## 2017-01-15 ENCOUNTER — Emergency Department (HOSPITAL_COMMUNITY): Payer: Medicare Other

## 2017-01-15 ENCOUNTER — Encounter (HOSPITAL_COMMUNITY): Payer: Self-pay | Admitting: *Deleted

## 2017-01-15 DIAGNOSIS — R0602 Shortness of breath: Secondary | ICD-10-CM | POA: Diagnosis not present

## 2017-01-15 DIAGNOSIS — J40 Bronchitis, not specified as acute or chronic: Secondary | ICD-10-CM | POA: Insufficient documentation

## 2017-01-15 DIAGNOSIS — R05 Cough: Secondary | ICD-10-CM | POA: Diagnosis not present

## 2017-01-15 LAB — CBC
HCT: 45.4 % (ref 39.0–52.0)
HEMOGLOBIN: 15.9 g/dL (ref 13.0–17.0)
MCH: 29.2 pg (ref 26.0–34.0)
MCHC: 35 g/dL (ref 30.0–36.0)
MCV: 83.3 fL (ref 78.0–100.0)
Platelets: 331 10*3/uL (ref 150–400)
RBC: 5.45 MIL/uL (ref 4.22–5.81)
RDW: 15.6 % — AB (ref 11.5–15.5)
WBC: 10.8 10*3/uL — AB (ref 4.0–10.5)

## 2017-01-15 LAB — BASIC METABOLIC PANEL
Anion gap: 11 (ref 5–15)
BUN: 12 mg/dL (ref 6–20)
CHLORIDE: 106 mmol/L (ref 101–111)
CO2: 24 mmol/L (ref 22–32)
Calcium: 9.4 mg/dL (ref 8.9–10.3)
Creatinine, Ser: 1.06 mg/dL (ref 0.61–1.24)
GFR calc Af Amer: >60 mL/min (ref >60)
GFR calc non Af Amer: >60 mL/min (ref >60)
Glucose, Bld: 117 mg/dL — ABNORMAL HIGH (ref 65–99)
POTASSIUM: 3.6 mmol/L (ref 3.5–5.1)
Sodium: 141 mmol/L (ref 135–145)

## 2017-01-15 LAB — I-STAT TROPONIN, ED: Troponin i, poc: 0.01 ng/mL (ref 0.00–0.08)

## 2017-01-15 NOTE — ED Triage Notes (Signed)
Pt c/o productive cough x 1 week with generalized weakness and poor PO intake per family

## 2017-01-16 ENCOUNTER — Emergency Department (HOSPITAL_COMMUNITY)
Admission: EM | Admit: 2017-01-16 | Discharge: 2017-01-16 | Disposition: A | Discharge status: Home / Self Care | Payer: Medicare Other | Attending: Emergency Medicine | Admitting: Emergency Medicine

## 2017-01-16 DIAGNOSIS — J40 Bronchitis, not specified as acute or chronic: Secondary | ICD-10-CM

## 2017-01-16 LAB — D-DIMER, QUANTITATIVE: D-Dimer, Quant: 0.45 ug/mL-FEU (ref 0.00–0.50)

## 2017-01-16 LAB — HEPATIC FUNCTION PANEL
ALK PHOS: 102 U/L (ref 38–126)
ALT: 59 U/L (ref 17–63)
AST: 46 U/L — ABNORMAL HIGH (ref 15–41)
Albumin: 4 g/dL (ref 3.5–5.0)
BILIRUBIN DIRECT: 0.3 mg/dL (ref 0.1–0.5)
BILIRUBIN INDIRECT: 1.1 mg/dL — AB (ref 0.3–0.9)
Total Bilirubin: 1.4 mg/dL — ABNORMAL HIGH (ref 0.3–1.2)
Total Protein: 7.8 g/dL (ref 6.5–8.1)

## 2017-01-16 LAB — I-STAT TROPONIN, ED: Troponin i, poc: 0.08 ng/mL (ref 0.00–0.08)

## 2017-01-16 LAB — BRAIN NATRIURETIC PEPTIDE: B Natriuretic Peptide: 64.2 pg/mL (ref 0.0–100.0)

## 2017-01-16 LAB — I-STAT CG4 LACTIC ACID, ED
LACTIC ACID, VENOUS: 1.47 mmol/L (ref 0.5–1.9)
Lactic Acid, Venous: 2.06 mmol/L (ref 0.5–1.9)

## 2017-01-16 LAB — LIPASE, BLOOD: LIPASE: 24 U/L (ref 11–51)

## 2017-01-16 MED ORDER — PREDNISONE 50 MG PO TABS: ORAL_TABLET | ORAL | 0 refills | Status: DC

## 2017-01-16 MED ORDER — DOXYCYCLINE HYCLATE 100 MG PO TABS
100.0000 mg | ORAL_TABLET | Freq: Once | ORAL | Status: AC
  Administered 2017-01-16: 100 mg via ORAL
  Filled 2017-01-16: qty 1

## 2017-01-16 MED ORDER — SODIUM CHLORIDE 0.9 % IV BOLUS (SEPSIS)
1000.0000 mL | Freq: Once | INTRAVENOUS | Status: AC
  Administered 2017-01-16: 1000 mL via INTRAVENOUS

## 2017-01-16 MED ORDER — IPRATROPIUM-ALBUTEROL 0.5-2.5 (3) MG/3ML IN SOLN
3.0000 mL | Freq: Once | RESPIRATORY_TRACT | Status: AC
  Administered 2017-01-16: 3 mL via RESPIRATORY_TRACT
  Filled 2017-01-16: qty 3

## 2017-01-16 MED ORDER — PREDNISONE 20 MG PO TABS
60.0000 mg | ORAL_TABLET | Freq: Once | ORAL | Status: AC
  Administered 2017-01-16: 60 mg via ORAL
  Filled 2017-01-16: qty 3

## 2017-01-16 MED ORDER — ALBUTEROL SULFATE HFA 108 (90 BASE) MCG/ACT IN AERS: 1.0000 | INHALATION_SPRAY | Freq: Four times a day (QID) | RESPIRATORY_TRACT | 0 refills | Status: DC | PRN

## 2017-01-16 MED ORDER — DOXYCYCLINE HYCLATE 100 MG PO CAPS: 100.0000 mg | ORAL_CAPSULE | Freq: Two times a day (BID) | ORAL | 0 refills | Status: DC

## 2017-01-16 NOTE — ED Provider Notes (Signed)
[facility] DEPT Provider Note   CSN: [id number] Arrival date & time: 01/15/17  2241     History   Chief Complaint Chief Complaint  Patient presents with  . Shortness of Breath  . Cough    HPI Eric Evans is a 82 y.o. male.  Patient presents with son. States she's had a cough productive of white mucus for the past 5 days with generalized weakness and poor by mouth intake. Denies any fever. Has shortness of breath with coughing spells only. Denies any chest pain. Previous smoker. No history of asthma or COPD but his wife does smoke at home. Denies fever. Has had a poor by mouth intake and been drinking mostly boost that has caused him some diarrhea. No vomiting. No abdominal pain. No recent sick contacts or recent travel. Has not seen a doctor in many years. Denies any heart or lung problems. His previous C-spine surgery and wears a soft neck collar at all times.   The history is provided by the patient and a relative.    History reviewed. No pertinent past medical history.  There are no active problems to display for this patient.   Past Surgical History:  Procedure Laterality Date  . CERVICAL SPINE SURGERY         Home Medications    Prior to Admission medications   Not on File    Family History No family history on file.  Social History Social History  Substance Use Topics  . Smoking status: Never Smoker  . Smokeless tobacco: Never Used  . Alcohol use No     Allergies   Patient has no known allergies.   Review of Systems Review of Systems  Constitutional: Positive for activity change, appetite change and fatigue.  HENT: Positive for congestion and rhinorrhea.   Eyes: Negative for visual disturbance.  Respiratory: Positive for cough and shortness of breath. Negative for chest tightness.   Gastrointestinal: Negative for abdominal pain, nausea and vomiting.  Genitourinary: Negative for dysuria, hematuria and urgency.  Musculoskeletal:  Negative for arthralgias, back pain, myalgias and neck pain.  Neurological: Positive for weakness. Negative for dizziness, light-headedness and headaches.   all other systems are negative except as noted in the HPI and PMH.     Physical Exam Updated Vital Signs BP (!) 143/87 (BP Location: Right Arm)   Pulse 74   Temp 98.1 F (36.7 C) (Oral)   Resp 16   SpO2 95%   Physical Exam  Constitutional: He is oriented to person, place, and time. He appears well-developed and well-nourished. No distress.  HENT:  Head: Normocephalic and atraumatic.  Mouth/Throat: Oropharynx is clear and moist. No oropharyngeal exudate.  Moist cough  Eyes: Pupils are equal, round, and reactive to light. Conjunctivae and EOM are normal.  Neck: Normal range of motion. Neck supple.  Soft C collar in place  Cardiovascular: Normal rate, regular rhythm, normal heart sounds and intact distal pulses.   No murmur heard. Pulmonary/Chest: Effort normal and breath sounds normal. No respiratory distress. He exhibits no tenderness.  Scattered coarse rhonchi. Diminished air exchange.  Abdominal: Soft. There is no tenderness. There is no rebound and no guarding.  Musculoskeletal: Normal range of motion. He exhibits no edema or tenderness.  Neurological: He is alert and oriented to person, place, and time. No cranial nerve deficit. He exhibits normal muscle tone. Coordination normal.  No ataxia on finger to nose bilaterally. No pronator drift. 5/5 strength throughout. CN 2-12 intact.Equal grip strength. Sensation intact.  Skin: Skin is warm.  Psychiatric: He has a normal mood and affect. His behavior is normal.  Nursing note and vitals reviewed.    ED Treatments / Results  Labs (all labs ordered are listed, but only abnormal results are displayed) Labs Reviewed  BASIC METABOLIC PANEL - Abnormal; Notable for the following:       Result Value   Glucose, Bld 117 (*)    All other components within normal limits  CBC -  Abnormal; Notable for the following:    WBC 10.8 (*)    RDW 15.6 (*)    All other components within normal limits  HEPATIC FUNCTION PANEL - Abnormal; Notable for the following:    AST 46 (*)    Total Bilirubin 1.4 (*)    Indirect Bilirubin 1.1 (*)    All other components within normal limits  I-STAT CG4 LACTIC ACID, ED - Abnormal; Notable for the following:    Lactic Acid, Venous 2.06 (*)    All other components within normal limits  LIPASE, BLOOD  BRAIN NATRIURETIC PEPTIDE  D-DIMER, QUANTITATIVE (NOT AT [facility])  I-STAT TROPONIN, ED  I-STAT CG4 LACTIC ACID, ED  I-STAT TROPONIN, ED    EKG  EKG Interpretation  Date/Time:  Sunday January 15 2017 22:47:31 EDT Ventricular Rate:  86 PR Interval:  118 QRS Duration: 160 QT Interval:  414 QTC Calculation: 495 R Axis:   -4 Text Interpretation:  * Poor data quality, interpretation may be adversely affected Normal sinus rhythm Left bundle branch block Abnormal ECG When compared with ECG of 01/09/2010, No significant change was found Confirmed by Delora Fuel ([id number]) on 01/15/2017 11:12:36 PM       Radiology Dg Chest 2 View  Result Date: 01/16/2017 CLINICAL DATA:  82 y/o M; one week of productive cough. Generalized weakness. EXAM: CHEST  2 VIEW COMPARISON:  01/16/2011 chest radiograph. FINDINGS: Stable mildly enlarged cardiac silhouette. Calcified pleural plaques are stable. No focal consolidation, pleural effusion, or pneumothorax identified. Aortic atherosclerosis with calcification. Right upper quadrant surgical clips, presumably cholecystectomy. Multilevel bridging syndesmophytes, question spondyloarthropathy. IMPRESSION: 1. No acute pulmonary process identified. 2. Stable calcified pleural plaques. 3. Multilevel bridging syndesmophytes, question spondyloarthropathy. 4. Stable mild cardiomegaly. Electronically Signed   By: Kristine Garbe M.D.   On: 01/16/2017 00:04    Procedures Procedures (including critical care  time)  Medications Ordered in ED Medications - No data to display   Initial Impression / Assessment and Plan / ED Course  I have reviewed the triage vital signs and the nursing notes.  Pertinent labs & imaging results that were available during my care of the patient were reviewed by me and considered in my medical decision making (see chart for details).     Patient with 5 days of productive cough of white mucus of generalized weakness and poor by mouth intake. No fever. No chest pain.  Patient in no distress. No hypoxia. Chest x-ray shows no acute infiltrate.  Labs are reassuring. Patient is hypertensive without a history of the same. No evidence of sepsis Troponin negative. D-dimer negative. Doubt ACS, PE, aortic dissection, CHF exacerbation.  Ambulatory without desaturation. We'll treat supportively for bronchitis. Troponin negative x2. Ambulatory without desaturation. Oral hydraion at home, PCP followup, return precautions discussed.  Final Clinical Impressions(s) / ED Diagnoses   Final diagnoses:  Bronchitis    New Prescriptions New Prescriptions   No medications on file     Ezequiel Essex, MD 01/16/17 2136

## 2017-01-16 NOTE — Discharge Instructions (Signed)
Take the antibiotics and steroids as prescribed. Keep yourself hydrated. Establish care with a primary doctor. Return to the ED if you develop chest pain, shortness of breath, or any other concerns.

## 2017-01-16 NOTE — ED Notes (Signed)
Pt ambulated efficiently while maintaining O2 stat above 95%.

## 2017-07-19 ENCOUNTER — Encounter (HOSPITAL_COMMUNITY): Payer: Self-pay | Admitting: Emergency Medicine

## 2017-07-19 ENCOUNTER — Emergency Department (HOSPITAL_COMMUNITY)
Admission: EM | Admit: 2017-07-19 | Discharge: 2017-07-19 | Disposition: A | Discharge status: Home / Self Care | Payer: Medicare Other | Attending: Emergency Medicine | Admitting: Emergency Medicine

## 2017-07-19 ENCOUNTER — Other Ambulatory Visit: Payer: Self-pay

## 2017-07-19 DIAGNOSIS — I509 Heart failure, unspecified: Secondary | ICD-10-CM | POA: Insufficient documentation

## 2017-07-19 DIAGNOSIS — R338 Other retention of urine: Secondary | ICD-10-CM

## 2017-07-19 DIAGNOSIS — R609 Edema, unspecified: Secondary | ICD-10-CM

## 2017-07-19 DIAGNOSIS — R600 Localized edema: Secondary | ICD-10-CM | POA: Diagnosis not present

## 2017-07-19 DIAGNOSIS — R102 Pelvic and perineal pain: Secondary | ICD-10-CM | POA: Diagnosis not present

## 2017-07-19 DIAGNOSIS — R339 Retention of urine, unspecified: Secondary | ICD-10-CM | POA: Diagnosis not present

## 2017-07-19 DIAGNOSIS — I110 Hypertensive heart disease with heart failure: Secondary | ICD-10-CM | POA: Insufficient documentation

## 2017-07-19 HISTORY — DX: Essential (primary) hypertension: I10

## 2017-07-19 HISTORY — DX: Heart failure, unspecified: I50.9

## 2017-07-19 LAB — URINALYSIS, ROUTINE W REFLEX MICROSCOPIC
BILIRUBIN URINE: NEGATIVE
Bacteria, UA: NONE SEEN
Glucose, UA: NEGATIVE mg/dL
KETONES UR: NEGATIVE mg/dL
Leukocytes, UA: NEGATIVE
NITRITE: NEGATIVE
PH: 5 (ref 5.0–8.0)
Protein, ur: NEGATIVE mg/dL
SPECIFIC GRAVITY, URINE: 1.008 (ref 1.005–1.030)
Squamous Epithelial / LPF: NONE SEEN

## 2017-07-19 MED ORDER — FUROSEMIDE 20 MG PO TABS: ORAL_TABLET | ORAL | 0 refills | Status: DC

## 2017-07-19 MED ORDER — LIDOCAINE HCL 2 % EX GEL
1.0000 "application " | Freq: Once | CUTANEOUS | Status: AC | PRN
  Administered 2017-07-19: 1 via URETHRAL
  Filled 2017-07-19: qty 5

## 2017-07-19 NOTE — ED Notes (Signed)
Catheter to leg bag  Family instructed on cath care and how to empty leg bag

## 2017-07-19 NOTE — ED Notes (Signed)
Bed: RESA Expected date:  Expected time:  Means of arrival:  Comments: EMS 83 yo urinary retention/pitting edema lower extemities

## 2017-07-19 NOTE — ED Provider Notes (Signed)
[facility] DEPT Provider Note: [name], MD, FACEP  CSN: [id number] MRN: [id number] ARRIVAL: 07/19/17 at [place]: RESA/RESA   CHIEF COMPLAINT  Urinary Retention   HISTORY OF PRESENT ILLNESS  07/19/17 4:56 AM Eric Evans is a 83 y.o. male who had the gradual onset of difficulty urinating throughout the day yesterday.  By 10 PM yesterday evening he was unable to urinate.  On arrival nursing staff noted him to have a distended, tender bladder.  Bedside bladder scan showed urinary retention and a Foley catheter was placed with significant relief.  He is also complaining of edema of his lower legs for several days.  He rated his pain prior to Foley placement as an 8 out of 10.  He does not currently have a primary care physician and is been noncompliant with medical care.  He wears a soft cervical collar for chronic neck pain status post remote cervical fusion.   Past Medical History:  Diagnosis Date  . CHF (congestive heart failure) ([place])   . Hypertension     Past Surgical History:  Procedure Laterality Date  . CERVICAL SPINE SURGERY      No family history on file.  Social History   Tobacco Use  . Smoking status: Never Smoker  . Smokeless tobacco: Never Used  Substance Use Topics  . Alcohol use: No  . Drug use: No    Prior to Admission medications   Medication Sig Start Date End Date Taking? Authorizing Provider  albuterol (PROVENTIL HFA;VENTOLIN HFA) 108 (90 Base) MCG/ACT inhaler Inhale 1-2 puffs into the lungs every 6 (six) hours as needed for wheezing or shortness of breath. 01/16/17   Rancour, Annie Main, MD  doxycycline (VIBRAMYCIN) 100 MG capsule Take 1 capsule (100 mg total) by mouth 2 (two) times daily. 01/16/17   Rancour, Annie Main, MD  predniSONE (DELTASONE) 50 MG tablet 1 tablet PO daily 01/16/17   Ezequiel Essex, MD    Allergies Patient has no known allergies.   REVIEW OF SYSTEMS  Negative except as noted here or in the History of Present  Illness.   PHYSICAL EXAMINATION  Initial Vital Signs Blood pressure (!) 179/85, pulse 72, temperature 98.3 F (36.8 C), temperature source Oral, resp. rate 19, SpO2 93 %.  Examination General: Well-developed, well-nourished male in no acute distress; appearance consistent with age of record HENT: normocephalic; atraumatic Eyes: pupils equal, round and reactive to light; extraocular muscles intact Neck: Immobilized in a soft collar Heart: regular rate and rhythm Lungs: clear to auscultation bilaterally Abdomen: soft; nondistended; nontender; bowel sounds present GU: Foley catheter draining clear yellow urine Extremities: No deformity; full range of motion; +1 pitting edema of lower legs Neurologic: Awake, alert and oriented; motor function intact in all extremities and symmetric; no facial droop Skin: Warm and dry Psychiatric: Normal mood and affect   RESULTS  Summary of this visit's results, reviewed by myself:   EKG Interpretation  Date/Time:    Ventricular Rate:    PR Interval:    QRS Duration:   QT Interval:    QTC Calculation:   R Axis:     Text Interpretation:        Laboratory Studies: Results for orders placed or performed during the hospital encounter of 07/19/17 (from the past 24 hour(s))  Urinalysis, Routine w reflex microscopic- may I&O cath if menses     Status: Abnormal   Collection Time: 07/19/17  3:03 AM  Result Value Ref Range   Color, Urine STRAW (A) YELLOW  APPearance CLEAR CLEAR   Specific Gravity, Urine 1.008 1.005 - 1.030   pH 5.0 5.0 - 8.0   Glucose, UA NEGATIVE NEGATIVE mg/dL   Hgb urine dipstick MODERATE (A) NEGATIVE   Bilirubin Urine NEGATIVE NEGATIVE   Ketones, ur NEGATIVE NEGATIVE mg/dL   Protein, ur NEGATIVE NEGATIVE mg/dL   Nitrite NEGATIVE NEGATIVE   Leukocytes, UA NEGATIVE NEGATIVE   RBC / HPF 6-30 0 - 5 RBC/hpf   WBC, UA 0-5 0 - 5 WBC/hpf   Bacteria, UA NONE SEEN NONE SEEN   Squamous Epithelial / LPF NONE SEEN NONE SEEN    Mucus PRESENT    Hyaline Casts, UA PRESENT    Imaging Studies: No results found.  ED COURSE  Nursing notes and initial vitals signs, including pulse oximetry, reviewed.  Vitals:   07/19/17 0304 07/19/17 0319  BP:  (!) 179/85  Pulse:  72  Resp:  19  Temp:  98.3 F (36.8 C)  TempSrc:  Oral  SpO2: 93% 93%    PROCEDURES    ED DIAGNOSES     ICD-10-CM   1. Acute urinary retention R33.8   2. Peripheral edema R60.9        Taraann Olthoff, Jenny Reichmann, MD 07/19/17 [phone]

## 2017-07-19 NOTE — ED Triage Notes (Signed)
Pt brought in from home via [facility] with difficulty urinating  Pt states he has the urge but is not able to pass his urine and is c/o pain to that area  Pt states the last time he voided was around 10pm  States throughout the day his urine was less and less  Pt has pitting edema to his legs bilaterally

## 2017-07-20 LAB — URINE CULTURE: CULTURE: NO GROWTH

## 2017-07-27 DIAGNOSIS — R338 Other retention of urine: Secondary | ICD-10-CM | POA: Diagnosis not present

## 2017-07-31 DIAGNOSIS — R338 Other retention of urine: Secondary | ICD-10-CM | POA: Diagnosis not present

## 2019-03-15 ENCOUNTER — Emergency Department (HOSPITAL_BASED_OUTPATIENT_CLINIC_OR_DEPARTMENT_OTHER)
Admission: EM | Admit: 2019-03-15 | Discharge: 2019-03-15 | Disposition: A | Discharge status: Home / Self Care | Payer: Medicare Other | Attending: Emergency Medicine | Admitting: Emergency Medicine

## 2019-03-15 ENCOUNTER — Other Ambulatory Visit: Payer: Self-pay

## 2019-03-15 ENCOUNTER — Encounter (HOSPITAL_BASED_OUTPATIENT_CLINIC_OR_DEPARTMENT_OTHER): Payer: Self-pay

## 2019-03-15 DIAGNOSIS — K2970 Gastritis, unspecified, without bleeding: Secondary | ICD-10-CM | POA: Diagnosis not present

## 2019-03-15 DIAGNOSIS — I110 Hypertensive heart disease with heart failure: Secondary | ICD-10-CM | POA: Diagnosis not present

## 2019-03-15 DIAGNOSIS — R195 Other fecal abnormalities: Secondary | ICD-10-CM | POA: Diagnosis not present

## 2019-03-15 DIAGNOSIS — Z79899 Other long term (current) drug therapy: Secondary | ICD-10-CM | POA: Diagnosis not present

## 2019-03-15 DIAGNOSIS — K921 Melena: Secondary | ICD-10-CM

## 2019-03-15 DIAGNOSIS — I509 Heart failure, unspecified: Secondary | ICD-10-CM | POA: Insufficient documentation

## 2019-03-15 DIAGNOSIS — Z87891 Personal history of nicotine dependence: Secondary | ICD-10-CM | POA: Diagnosis not present

## 2019-03-15 DIAGNOSIS — K2971 Gastritis, unspecified, with bleeding: Secondary | ICD-10-CM | POA: Diagnosis not present

## 2019-03-15 DIAGNOSIS — R109 Unspecified abdominal pain: Secondary | ICD-10-CM | POA: Diagnosis present

## 2019-03-15 LAB — COMPREHENSIVE METABOLIC PANEL
ALT: 29 U/L (ref 0–44)
AST: 53 U/L — ABNORMAL HIGH (ref 15–41)
Albumin: 3.8 g/dL (ref 3.5–5.0)
Alkaline Phosphatase: 177 U/L — ABNORMAL HIGH (ref 38–126)
Anion gap: 16 — ABNORMAL HIGH (ref 5–15)
BUN: 12 mg/dL (ref 8–23)
CO2: 25 mmol/L (ref 22–32)
Calcium: 9.3 mg/dL (ref 8.9–10.3)
Chloride: 100 mmol/L (ref 98–111)
Creatinine, Ser: 0.95 mg/dL (ref 0.61–1.24)
GFR calc Af Amer: >60 mL/min (ref >60)
GFR calc non Af Amer: >60 mL/min (ref >60)
Glucose, Bld: 117 mg/dL — ABNORMAL HIGH (ref 70–99)
Potassium: 3.4 mmol/L — ABNORMAL LOW (ref 3.5–5.1)
Sodium: 141 mmol/L (ref 135–145)
Total Bilirubin: 1.3 mg/dL — ABNORMAL HIGH (ref 0.3–1.2)
Total Protein: 7.9 g/dL (ref 6.5–8.1)

## 2019-03-15 LAB — CBC WITH DIFFERENTIAL/PLATELET
Abs Immature Granulocytes: 0.02 10*3/uL (ref 0.00–0.07)
Basophils Absolute: 0 10*3/uL (ref 0.0–0.1)
Basophils Relative: 0 %
Eosinophils Absolute: 0.2 10*3/uL (ref 0.0–0.5)
Eosinophils Relative: 2 %
HCT: 46.2 % (ref 39.0–52.0)
Hemoglobin: 15 g/dL (ref 13.0–17.0)
Immature Granulocytes: 0 %
Lymphocytes Relative: 13 %
Lymphs Abs: 1.3 10*3/uL (ref 0.7–4.0)
MCH: 28.1 pg (ref 26.0–34.0)
MCHC: 32.5 g/dL (ref 30.0–36.0)
MCV: 86.5 fL (ref 80.0–100.0)
Monocytes Absolute: 0.8 10*3/uL (ref 0.1–1.0)
Monocytes Relative: 8 %
Neutro Abs: 7.3 10*3/uL (ref 1.7–7.7)
Neutrophils Relative %: 77 %
Platelets: 411 10*3/uL — ABNORMAL HIGH (ref 150–400)
RBC: 5.34 MIL/uL (ref 4.22–5.81)
RDW: 14.7 % (ref 11.5–15.5)
WBC: 9.6 10*3/uL (ref 4.0–10.5)
nRBC: 0 % (ref 0.0–0.2)

## 2019-03-15 LAB — OCCULT BLOOD X 1 CARD TO LAB, STOOL: Fecal Occult Bld: POSITIVE — AB

## 2019-03-15 MED ORDER — ALUM & MAG HYDROXIDE-SIMETH 200-200-20 MG/5ML PO SUSP
30.0000 mL | Freq: Once | ORAL | Status: AC
  Administered 2019-03-15: 14:00:00 30 mL via ORAL
  Filled 2019-03-15: qty 30

## 2019-03-15 MED ORDER — OMEPRAZOLE 20 MG PO CPDR: 20.0000 mg | DELAYED_RELEASE_CAPSULE | Freq: Every day | ORAL | 0 refills | Status: DC

## 2019-03-15 MED ORDER — FAMOTIDINE 20 MG PO TABS
20.0000 mg | ORAL_TABLET | Freq: Two times a day (BID) | ORAL | 0 refills | Status: DC
Start: 2019-03-15 — End: 2019-03-15

## 2019-03-15 MED ORDER — FAMOTIDINE 20 MG PO TABS: 20.0000 mg | ORAL_TABLET | Freq: Two times a day (BID) | ORAL | 0 refills | Status: DC

## 2019-03-15 MED ORDER — FAMOTIDINE IN NACL 20-0.9 MG/50ML-% IV SOLN
20.0000 mg | Freq: Once | INTRAVENOUS | Status: AC
Start: 2019-03-15 — End: 2019-03-15
  Administered 2019-03-15: 14:00:00 20 mg via INTRAVENOUS
  Filled 2019-03-15: qty 50

## 2019-03-15 MED FILL — FAMOTIDINE 20 MG TABS: 20 | 60 days supply | Qty: 120 | Fill #0

## 2019-03-15 MED FILL — OMEPRAZOLE 20 MG CAP: 20 | 60 days supply | Qty: 60 | Fill #0

## 2019-03-15 NOTE — ED Triage Notes (Addendum)
Per pt and wife pt with abd pain, black stools and decreased po intake x 12.5 weeks-NAD-to triage in w/c-wife states pt was seen at [facility] on 10/5 and advised to go to [facility] ED-pt refused

## 2019-03-15 NOTE — ED Provider Notes (Signed)
[facility] EMERGENCY DEPARTMENT Provider Note   CSN: [id number] Arrival date & time: 03/15/19  1219     History   Chief Complaint Chief Complaint  Patient presents with  . Abdominal Pain    HPI Eric Evans is a 84 y.o. male with a history of hypertension presenting to the emergency department with abdominal pain and black stools.  He reports he has had poor oral intake for the past couple of months.  His wife says that she has a hard time getting him to keep down any solid food but is been given him a lot of soups and ginger ale.  He states that he has felt persistent abdominal pain within the past week.  It is mostly epigastric.  It comes and goes.  He cannot describe the pain.    He then reports in the past week he has had 2 black stools.  After the first stool, he went to urgent care, where he was told he should come to the ER, however the patient refused at that time.  This morning he had another black bowel movement. There was no frank blood in the toilet bowl.   He denies any shortness of breath or lightheadedness.  He denies any fevers or chills.    He is unsure if he is ever had a colonoscopy before.  He is unsure if he has any history of gastric ulcers.  He reports he has not seen a doctor taken medicine in over 5 years.  He is not on blood thinners.    HPI  Past Medical History:  Diagnosis Date  . CHF (congestive heart failure) ([place])   . Hypertension     There are no active problems to display for this patient.   Past Surgical History:  Procedure Laterality Date  . CERVICAL SPINE SURGERY          Home Medications    Prior to Admission medications   Medication Sig Start Date End Date Taking? Authorizing Provider  albuterol (PROVENTIL HFA;VENTOLIN HFA) 108 (90 Base) MCG/ACT inhaler Inhale 1-2 puffs into the lungs every 6 (six) hours as needed for wheezing or shortness of breath. 01/16/17   Rancour, Annie Main, MD  famotidine (PEPCID) 20 MG tablet  Take 1 tablet (20 mg total) by mouth 2 (two) times daily. Take before breakfast and before dinner 03/15/19 05/14/19  Wyvonnia Dusky, MD  furosemide (LASIX) 20 MG tablet Take 1 tablet daily as needed for swelling. 07/19/17   Molpus, John, MD  omeprazole (PRILOSEC) 20 MG capsule Take 1 capsule (20 mg total) by mouth daily. Take before breakfast 03/15/19 05/14/19  Wyvonnia Dusky, MD    Family History No family history on file.  Social History Social History   Tobacco Use  . Smoking status: Former Research scientist (life sciences)  . Smokeless tobacco: Never Used  Substance Use Topics  . Alcohol use: No  . Drug use: No     Allergies   Patient has no known allergies.   Review of Systems Review of Systems  Constitutional: Positive for appetite change. Negative for chills and fever.  Respiratory: Negative for cough and shortness of breath.   Cardiovascular: Negative for chest pain and palpitations.  Gastrointestinal: Positive for blood in stool, constipation and nausea. Negative for abdominal pain, rectal pain and vomiting.  Musculoskeletal: Negative for arthralgias and back pain.  Skin: Negative for pallor and rash.  Neurological: Negative for dizziness, syncope and light-headedness.  Psychiatric/Behavioral: Negative for agitation and confusion.  All  other systems reviewed and are negative.    Physical Exam Updated Vital Signs BP 128/70   Pulse 78   Temp 98 F (36.7 C) (Oral)   Resp 16   Ht 5\' 4"  (1.626 m)   SpO2 99%   Physical Exam Vitals signs and nursing note reviewed.  Constitutional:      Appearance: He is well-developed.  HENT:     Head: Normocephalic and atraumatic.     Comments: Neck brace (chronic, worn for comfort) Eyes:     Conjunctiva/sclera: Conjunctivae normal.  Neck:     Musculoskeletal: Neck supple.  Cardiovascular:     Rate and Rhythm: Normal rate and regular rhythm.     Heart sounds: Normal heart sounds.  Pulmonary:     Effort: Pulmonary effort is normal. No  respiratory distress.  Abdominal:     Palpations: Abdomen is soft.     Tenderness: There is no abdominal tenderness. There is no guarding or rebound. Negative signs include Murphy's sign, Rovsing's sign, McBurney's sign and psoas sign.  Genitourinary:    Comments: Rectal exam melena negative, Hemoccult test pending Small external hemorrhoid Skin:    General: Skin is warm and dry.  Neurological:     Mental Status: He is alert.      ED Treatments / Results  Labs (all labs ordered are listed, but only abnormal results are displayed) Labs Reviewed  CBC WITH DIFFERENTIAL/PLATELET - Abnormal; Notable for the following components:      Result Value   Platelets 411 (*)    All other components within normal limits  COMPREHENSIVE METABOLIC PANEL - Abnormal; Notable for the following components:   Potassium 3.4 (*)    Glucose, Bld 117 (*)    AST 53 (*)    Alkaline Phosphatase 177 (*)    Total Bilirubin 1.3 (*)    Anion gap 16 (*)    All other components within normal limits  OCCULT BLOOD X 1 CARD TO LAB, STOOL - Abnormal; Notable for the following components:   Fecal Occult Bld POSITIVE (*)    All other components within normal limits    EKG None  Radiology No results found.  Procedures Procedures (including critical care time)  Medications Ordered in ED Medications  famotidine (PEPCID) IVPB 20 mg premix (0 mg Intravenous Stopped 03/15/19 1510)  alum & mag hydroxide-simeth (MAALOX/MYLANTA) 200-200-20 MG/5ML suspension 30 mL (30 mLs Oral Given 03/15/19 1411)     Initial Impression / Assessment and Plan / ED Course  I have reviewed the triage vital signs and the nursing notes.  Pertinent labs & imaging results that were available during my care of the patient were reviewed by me and considered in my medical decision making (see chart for details).  84 year old male presenting to the emergency department abdominal pain for several days and 2 episodes of black stools. He has  an epigastric discomfort that is worse with eating.  Strongly suggestive of a gastric or duodenal ulcer.  He otherwise has a fairly benign abdominal exam and I have a low suspicion for perforation.  He is not complaining of significant abdominal pain.  His Hemoccult test is pending, negative for melena my exam. There is no signs or symptoms of acute anemia. We will try GI cocktail and IV Pepcid while waiting for labs.   This note was dictated using dragon dictation software.  Please be aware that there may be minor translation errors as a result of this oral dictation   Clinical Course as  of Mar 14 1745  Fri Mar 15, 2019  1447 With normal hemoglobin and normal vital signs believe the patient be safely discharged home.  I explained to him and his wife that he does not to see a gastroenterologist very likely needs an endoscopy.  Also I will start him on omeprazole and Pepcid for possible gastric ulcer.  We will discharge him.   [MT]    Clinical Course User Index [MT] Wyvonnia Dusky, MD     Final Clinical Impressions(s) / ED Diagnoses   Final diagnoses:  Blood in stool  Gastritis with hemorrhage, unspecified chronicity, unspecified gastritis type    ED Discharge Orders         Ordered    omeprazole (PRILOSEC) 20 MG capsule  Daily,   Status:  Discontinued     03/15/19 1531    famotidine (PEPCID) 20 MG tablet  2 times daily,   Status:  Discontinued     03/15/19 1531    famotidine (PEPCID) 20 MG tablet  2 times daily     03/15/19 1532    omeprazole (PRILOSEC) 20 MG capsule  Daily     03/15/19 1532           Wyvonnia Dusky, MD 03/15/19 1746

## 2019-03-15 NOTE — Discharge Instructions (Signed)
You may have an ulcer in your stomach or your intestines.  You should start taking the medications I prescribed.  Take the time to read over the instructions I've included.  Please call to make an appointment with a GI doctor.

## 2019-03-19 ENCOUNTER — Other Ambulatory Visit: Payer: Self-pay | Admitting: Gastroenterology

## 2019-03-19 DIAGNOSIS — R634 Abnormal weight loss: Secondary | ICD-10-CM

## 2019-03-19 DIAGNOSIS — R0789 Other chest pain: Secondary | ICD-10-CM | POA: Diagnosis not present

## 2019-03-19 DIAGNOSIS — R1311 Dysphagia, oral phase: Secondary | ICD-10-CM | POA: Diagnosis not present

## 2019-03-19 DIAGNOSIS — R1310 Dysphagia, unspecified: Secondary | ICD-10-CM

## 2019-03-21 ENCOUNTER — Emergency Department (HOSPITAL_COMMUNITY): Payer: Medicare Other

## 2019-03-21 ENCOUNTER — Ambulatory Visit (HOSPITAL_COMMUNITY): Admission: RE | Admit: 2019-03-21 | Payer: Medicare Other | Source: Ambulatory Visit

## 2019-03-21 ENCOUNTER — Encounter (HOSPITAL_COMMUNITY): Payer: Self-pay

## 2019-03-21 ENCOUNTER — Inpatient Hospital Stay (HOSPITAL_COMMUNITY)
Admission: EM | Admit: 2019-03-21 | Discharge: 2019-03-25 | DRG: 375 | Disposition: A | Discharge status: Hospice | Payer: Medicare Other | Attending: Internal Medicine | Admitting: Internal Medicine

## 2019-03-21 ENCOUNTER — Other Ambulatory Visit: Payer: Self-pay

## 2019-03-21 DIAGNOSIS — R319 Hematuria, unspecified: Secondary | ICD-10-CM | POA: Diagnosis not present

## 2019-03-21 DIAGNOSIS — Z87891 Personal history of nicotine dependence: Secondary | ICD-10-CM | POA: Diagnosis not present

## 2019-03-21 DIAGNOSIS — Z03818 Encounter for observation for suspected exposure to other biological agents ruled out: Secondary | ICD-10-CM | POA: Diagnosis not present

## 2019-03-21 DIAGNOSIS — Z66 Do not resuscitate: Secondary | ICD-10-CM | POA: Diagnosis present

## 2019-03-21 DIAGNOSIS — Z77090 Contact with and (suspected) exposure to asbestos: Secondary | ICD-10-CM | POA: Diagnosis not present

## 2019-03-21 DIAGNOSIS — N401 Enlarged prostate with lower urinary tract symptoms: Secondary | ICD-10-CM | POA: Diagnosis present

## 2019-03-21 DIAGNOSIS — K228 Other specified diseases of esophagus: Secondary | ICD-10-CM | POA: Diagnosis not present

## 2019-03-21 DIAGNOSIS — R160 Hepatomegaly, not elsewhere classified: Secondary | ICD-10-CM | POA: Diagnosis not present

## 2019-03-21 DIAGNOSIS — Z20828 Contact with and (suspected) exposure to other viral communicable diseases: Secondary | ICD-10-CM | POA: Diagnosis present

## 2019-03-21 DIAGNOSIS — N138 Other obstructive and reflux uropathy: Secondary | ICD-10-CM | POA: Diagnosis not present

## 2019-03-21 DIAGNOSIS — R05 Cough: Secondary | ICD-10-CM | POA: Diagnosis not present

## 2019-03-21 DIAGNOSIS — R109 Unspecified abdominal pain: Secondary | ICD-10-CM | POA: Diagnosis not present

## 2019-03-21 DIAGNOSIS — K921 Melena: Secondary | ICD-10-CM | POA: Diagnosis present

## 2019-03-21 DIAGNOSIS — Z7189 Other specified counseling: Secondary | ICD-10-CM

## 2019-03-21 DIAGNOSIS — E872 Acidosis: Secondary | ICD-10-CM | POA: Diagnosis present

## 2019-03-21 DIAGNOSIS — R338 Other retention of urine: Secondary | ICD-10-CM | POA: Diagnosis not present

## 2019-03-21 DIAGNOSIS — C259 Malignant neoplasm of pancreas, unspecified: Secondary | ICD-10-CM | POA: Diagnosis not present

## 2019-03-21 DIAGNOSIS — Z515 Encounter for palliative care: Secondary | ICD-10-CM | POA: Diagnosis not present

## 2019-03-21 DIAGNOSIS — E871 Hypo-osmolality and hyponatremia: Secondary | ICD-10-CM | POA: Diagnosis present

## 2019-03-21 DIAGNOSIS — Z960 Presence of urogenital implants: Secondary | ICD-10-CM | POA: Diagnosis not present

## 2019-03-21 DIAGNOSIS — C155 Malignant neoplasm of lower third of esophagus: Secondary | ICD-10-CM | POA: Diagnosis present

## 2019-03-21 DIAGNOSIS — M542 Cervicalgia: Secondary | ICD-10-CM | POA: Diagnosis not present

## 2019-03-21 DIAGNOSIS — R0789 Other chest pain: Secondary | ICD-10-CM | POA: Diagnosis present

## 2019-03-21 DIAGNOSIS — C7800 Secondary malignant neoplasm of unspecified lung: Secondary | ICD-10-CM | POA: Diagnosis present

## 2019-03-21 DIAGNOSIS — R918 Other nonspecific abnormal finding of lung field: Secondary | ICD-10-CM | POA: Diagnosis not present

## 2019-03-21 DIAGNOSIS — R1310 Dysphagia, unspecified: Secondary | ICD-10-CM | POA: Diagnosis not present

## 2019-03-21 DIAGNOSIS — I110 Hypertensive heart disease with heart failure: Secondary | ICD-10-CM | POA: Diagnosis not present

## 2019-03-21 DIAGNOSIS — K2289 Other specified disease of esophagus: Secondary | ICD-10-CM

## 2019-03-21 DIAGNOSIS — Z79899 Other long term (current) drug therapy: Secondary | ICD-10-CM

## 2019-03-21 DIAGNOSIS — I10 Essential (primary) hypertension: Secondary | ICD-10-CM | POA: Diagnosis not present

## 2019-03-21 DIAGNOSIS — Z800 Family history of malignant neoplasm of digestive organs: Secondary | ICD-10-CM

## 2019-03-21 DIAGNOSIS — Z8249 Family history of ischemic heart disease and other diseases of the circulatory system: Secondary | ICD-10-CM | POA: Diagnosis not present

## 2019-03-21 DIAGNOSIS — R7401 Elevation of levels of liver transaminase levels: Secondary | ICD-10-CM | POA: Diagnosis not present

## 2019-03-21 DIAGNOSIS — R933 Abnormal findings on diagnostic imaging of other parts of digestive tract: Secondary | ICD-10-CM | POA: Diagnosis not present

## 2019-03-21 DIAGNOSIS — C154 Malignant neoplasm of middle third of esophagus: Secondary | ICD-10-CM | POA: Diagnosis not present

## 2019-03-21 DIAGNOSIS — R042 Hemoptysis: Secondary | ICD-10-CM | POA: Diagnosis not present

## 2019-03-21 DIAGNOSIS — H9190 Unspecified hearing loss, unspecified ear: Secondary | ICD-10-CM | POA: Diagnosis present

## 2019-03-21 DIAGNOSIS — I509 Heart failure, unspecified: Secondary | ICD-10-CM | POA: Diagnosis not present

## 2019-03-21 DIAGNOSIS — R634 Abnormal weight loss: Secondary | ICD-10-CM | POA: Diagnosis present

## 2019-03-21 DIAGNOSIS — C159 Malignant neoplasm of esophagus, unspecified: Secondary | ICD-10-CM | POA: Diagnosis not present

## 2019-03-21 DIAGNOSIS — C787 Secondary malignant neoplasm of liver and intrahepatic bile duct: Secondary | ICD-10-CM | POA: Diagnosis not present

## 2019-03-21 DIAGNOSIS — J920 Pleural plaque with presence of asbestos: Secondary | ICD-10-CM | POA: Diagnosis present

## 2019-03-21 DIAGNOSIS — K219 Gastro-esophageal reflux disease without esophagitis: Secondary | ICD-10-CM | POA: Diagnosis not present

## 2019-03-21 DIAGNOSIS — K229 Disease of esophagus, unspecified: Secondary | ICD-10-CM | POA: Diagnosis not present

## 2019-03-21 DIAGNOSIS — R1314 Dysphagia, pharyngoesophageal phase: Secondary | ICD-10-CM | POA: Diagnosis not present

## 2019-03-21 LAB — COMPREHENSIVE METABOLIC PANEL
ALT: 49 U/L — ABNORMAL HIGH (ref 0–44)
AST: 89 U/L — ABNORMAL HIGH (ref 15–41)
Albumin: 3.2 g/dL — ABNORMAL LOW (ref 3.5–5.0)
Alkaline Phosphatase: 217 U/L — ABNORMAL HIGH (ref 38–126)
Anion gap: 21 — ABNORMAL HIGH (ref 5–15)
BUN: 13 mg/dL (ref 8–23)
CO2: 20 mmol/L — ABNORMAL LOW (ref 22–32)
Calcium: 9.1 mg/dL (ref 8.9–10.3)
Chloride: 98 mmol/L (ref 98–111)
Creatinine, Ser: 1.14 mg/dL (ref 0.61–1.24)
GFR calc Af Amer: >60 mL/min (ref >60)
GFR calc non Af Amer: 60 mL/min — ABNORMAL LOW (ref >60)
Glucose, Bld: 112 mg/dL — ABNORMAL HIGH (ref 70–99)
Potassium: 3.3 mmol/L — ABNORMAL LOW (ref 3.5–5.1)
Sodium: 139 mmol/L (ref 135–145)
Total Bilirubin: 2.3 mg/dL — ABNORMAL HIGH (ref 0.3–1.2)
Total Protein: 7 g/dL (ref 6.5–8.1)

## 2019-03-21 LAB — POC OCCULT BLOOD, ED: Fecal Occult Bld: POSITIVE — AB

## 2019-03-21 LAB — CBC WITH DIFFERENTIAL/PLATELET
Basophils Absolute: 0 10*3/uL (ref 0.0–0.1)
Basophils Relative: 0 %
Eosinophils Absolute: 0.1 10*3/uL (ref 0.0–0.5)
Eosinophils Relative: 1 %
HCT: 43 % (ref 39.0–52.0)
Hemoglobin: 14.8 g/dL (ref 13.0–17.0)
Lymphocytes Relative: 8 %
Lymphs Abs: 0.9 10*3/uL (ref 0.7–4.0)
MCH: 29.1 pg (ref 26.0–34.0)
MCHC: 34.4 g/dL (ref 30.0–36.0)
MCV: 84.6 fL (ref 80.0–100.0)
Monocytes Absolute: 1.1 10*3/uL — ABNORMAL HIGH (ref 0.1–1.0)
Monocytes Relative: 9 %
Neutro Abs: 9.7 10*3/uL — ABNORMAL HIGH (ref 1.7–7.7)
Neutrophils Relative %: 81 %
Platelets: 401 10*3/uL — ABNORMAL HIGH (ref 150–400)
RBC: 5.08 MIL/uL (ref 4.22–5.81)
RDW: 14.6 % (ref 11.5–15.5)
WBC: 11.9 10*3/uL — ABNORMAL HIGH (ref 4.0–10.5)

## 2019-03-21 LAB — URINALYSIS, ROUTINE W REFLEX MICROSCOPIC
Bilirubin Urine: NEGATIVE
Glucose, UA: NEGATIVE mg/dL
Ketones, ur: 80 mg/dL — AB
Leukocytes,Ua: NEGATIVE
Nitrite: NEGATIVE
Protein, ur: NEGATIVE mg/dL
Specific Gravity, Urine: >1.046 — ABNORMAL HIGH (ref 1.005–1.030)
pH: 5 (ref 5.0–8.0)

## 2019-03-21 LAB — PROTIME-INR
INR: 1.2 (ref 0.8–1.2)
Prothrombin Time: 14.9 seconds (ref 11.4–15.2)

## 2019-03-21 LAB — SARS CORONAVIRUS 2 (TAT 6-24 HRS): SARS Coronavirus 2: NEGATIVE

## 2019-03-21 LAB — APTT: aPTT: 35 seconds (ref 24–36)

## 2019-03-21 LAB — LIPASE, BLOOD: Lipase: 32 U/L (ref 11–51)

## 2019-03-21 MED ORDER — MORPHINE SULFATE (PF) 2 MG/ML IV SOLN
2.0000 mg | INTRAVENOUS | Status: DC | PRN
  Administered 2019-03-21 – 2019-03-22 (×4): 4 mg via INTRAVENOUS
  Filled 2019-03-21 (×4): qty 2

## 2019-03-21 MED ORDER — FENTANYL CITRATE (PF) 100 MCG/2ML IJ SOLN
50.0000 ug | Freq: Once | INTRAMUSCULAR | Status: AC
  Administered 2019-03-21: 50 ug via INTRAVENOUS
  Filled 2019-03-21: qty 2

## 2019-03-21 MED ORDER — SODIUM CHLORIDE 0.9 % IV SOLN
INTRAVENOUS | Status: DC
  Administered 2019-03-21 – 2019-03-22 (×2): via INTRAVENOUS
  Administered 2019-03-22: 75 mL/h via INTRAVENOUS
  Administered 2019-03-23: 11:00:00 via INTRAVENOUS

## 2019-03-21 MED ORDER — SODIUM CHLORIDE 0.9 % IV SOLN: INTRAVENOUS | Status: DC

## 2019-03-21 MED ORDER — MUPIROCIN 2 % EX OINT
1.0000 "application " | TOPICAL_OINTMENT | Freq: Two times a day (BID) | CUTANEOUS | Status: DC
  Administered 2019-03-22 – 2019-03-25 (×6): 1 via NASAL
  Filled 2019-03-21 (×2): qty 22

## 2019-03-21 MED ORDER — ENOXAPARIN SODIUM 40 MG/0.4ML ~~LOC~~ SOLN
40.0000 mg | SUBCUTANEOUS | Status: DC
  Administered 2019-03-21: 40 mg via SUBCUTANEOUS
  Filled 2019-03-21: qty 0.4

## 2019-03-21 MED ORDER — POTASSIUM CHLORIDE 10 MEQ/100ML IV SOLN
10.0000 meq | INTRAVENOUS | Status: AC
  Administered 2019-03-21 (×3): 10 meq via INTRAVENOUS
  Filled 2019-03-21 (×3): qty 100

## 2019-03-21 MED ORDER — STERILE WATER FOR INJECTION IJ SOLN
INTRAMUSCULAR | Status: AC
  Administered 2019-03-21: 8 mL
  Filled 2019-03-21: qty 10

## 2019-03-21 MED ORDER — IOHEXOL 300 MG/ML  SOLN
80.0000 mL | Freq: Once | INTRAMUSCULAR | Status: AC | PRN
  Administered 2019-03-21: 100 mL via INTRAVENOUS

## 2019-03-21 MED ORDER — ACETAMINOPHEN 325 MG PO TABS
650.0000 mg | ORAL_TABLET | Freq: Four times a day (QID) | ORAL | Status: DC | PRN
  Administered 2019-03-22: 650 mg via ORAL
  Filled 2019-03-21: qty 2

## 2019-03-21 MED ORDER — FENTANYL CITRATE (PF) 100 MCG/2ML IJ SOLN
50.0000 ug | Freq: Once | INTRAMUSCULAR | Status: AC
  Administered 2019-03-21: 14:00:00 50 ug via INTRAVENOUS

## 2019-03-21 MED ORDER — FENTANYL CITRATE (PF) 100 MCG/2ML IJ SOLN
INTRAMUSCULAR | Status: AC
  Administered 2019-03-21: 50 ug via INTRAVENOUS
  Filled 2019-03-21: qty 2

## 2019-03-21 MED ORDER — ACETAMINOPHEN 650 MG RE SUPP: 650.0000 mg | Freq: Four times a day (QID) | RECTAL | Status: DC | PRN

## 2019-03-21 MED ORDER — PANTOPRAZOLE SODIUM 40 MG IV SOLR
40.0000 mg | Freq: Once | INTRAVENOUS | Status: AC
  Administered 2019-03-21: 40 mg via INTRAVENOUS
  Filled 2019-03-21: qty 40

## 2019-03-21 NOTE — ED Notes (Signed)
Patient transported to CT 

## 2019-03-21 NOTE — ED Provider Notes (Signed)
[facility] EMERGENCY DEPARTMENT Provider Note   CSN: [id number] Arrival date & time: 03/21/19  0740     History   Chief Complaint Chief Complaint  Patient presents with  . Abdominal Pain    HPI Eric Evans is a 84 y.o. male.     The history is provided by the patient and medical records. No language interpreter was used.  Abdominal Pain  Eric Evans is a 84 y.o. male  with a PMH of HTN, CHF who presents to the Emergency Department complaining of acute onset of right sided abdominal pain which began early this morning.  Described as sharp and constant.  He reports no new cough, does intermittently have a cough at baseline and when this happens, does make his pain worse.  Denies any shortness of breath.  No fever or chills.  Denies chest pain.  No nausea, vomiting, diarrhea or constipation.  He was seen in the emergency department 6 days ago for blood in his stool.  He states that this has now resolved.  He has not had any blood in his stool for the last several days.  Patient states that he has had difficulty swallowing for several months that is getting worse.  Over the last 3 weeks, he has been unable to eat any solid foods.  He has been able to tolerate liquids, but it is becoming more and more difficult for him by the day.  Had an upper GI series ordered by Dr. Watt Climes with gastroenterology which was actually scheduled for today. He does not have a primary care doctor.    Past Medical History:  Diagnosis Date  . CHF (congestive heart failure) ([place])   . Hypertension     Patient Active Problem List   Diagnosis Date Noted  . Esophageal mass 03/21/2019    Past Surgical History:  Procedure Laterality Date  . CERVICAL SPINE SURGERY          Home Medications    Prior to Admission medications   Medication Sig Start Date End Date Taking? Authorizing Provider  famotidine (PEPCID) 20 MG tablet Take 1 tablet (20 mg total) by mouth 2 (two) times  daily. Take before breakfast and before dinner 03/15/19 05/14/19 Yes Trifan, Carola Rhine, MD  omeprazole (PRILOSEC) 20 MG capsule Take 1 capsule (20 mg total) by mouth daily. Take before breakfast 03/15/19 05/14/19 Yes Trifan, Carola Rhine, MD  albuterol (PROVENTIL HFA;VENTOLIN HFA) 108 (90 Base) MCG/ACT inhaler Inhale 1-2 puffs into the lungs every 6 (six) hours as needed for wheezing or shortness of breath. Patient not taking: Reported on 03/21/2019 01/16/17   Rancour, Annie Main, MD  furosemide (LASIX) 20 MG tablet Take 1 tablet daily as needed for swelling. Patient not taking: Reported on 03/21/2019 07/19/17   Molpus, Jenny Reichmann, MD    Family History No family history on file.  Social History Social History   Tobacco Use  . Smoking status: Former Research scientist (life sciences)  . Smokeless tobacco: Never Used  Substance Use Topics  . Alcohol use: No  . Drug use: No     Allergies   Patient has no known allergies.   Review of Systems Review of Systems  HENT: Positive for trouble swallowing.   Gastrointestinal: Positive for abdominal pain.  All other systems reviewed and are negative.    Physical Exam Updated Vital Signs BP (!) 160/78   Pulse 78   Temp 99.6 F (37.6 C) (Rectal)   Resp (!) 24   Ht 5\' 4"  (1.626  m)   Wt 72.6 kg   SpO2 96%   BMI 27.46 kg/m   Physical Exam Vitals signs and nursing note reviewed.  Constitutional:      General: He is not in acute distress.    Appearance: He is well-developed.  HENT:     Head: Normocephalic and atraumatic.  Neck:     Musculoskeletal: Neck supple.  Cardiovascular:     Rate and Rhythm: Normal rate and regular rhythm.     Heart sounds: Normal heart sounds. No murmur.  Pulmonary:     Effort: Pulmonary effort is normal. No respiratory distress.     Breath sounds: Normal breath sounds. No wheezing.  Abdominal:     General: There is no distension.     Palpations: Abdomen is soft.     Comments: Tenderness to upper and lower right abdomen.  Skin:    General:  Skin is warm and dry.  Neurological:     Mental Status: He is alert and oriented to person, place, and time.      ED Treatments / Results  Labs (all labs ordered are listed, but only abnormal results are displayed) Labs Reviewed  COMPREHENSIVE METABOLIC PANEL - Abnormal; Notable for the following components:      Result Value   Potassium 3.3 (*)    CO2 20 (*)    Glucose, Bld 112 (*)    Albumin 3.2 (*)    AST 89 (*)    ALT 49 (*)    Alkaline Phosphatase 217 (*)    Total Bilirubin 2.3 (*)    GFR calc non Af Amer 60 (*)    Anion gap 21 (*)    All other components within normal limits  CBC WITH DIFFERENTIAL/PLATELET - Abnormal; Notable for the following components:   WBC 11.9 (*)    Platelets 401 (*)    Neutro Abs 9.7 (*)    Monocytes Absolute 1.1 (*)    All other components within normal limits  URINALYSIS, ROUTINE W REFLEX MICROSCOPIC - Abnormal; Notable for the following components:   Specific Gravity, Urine >1.046 (*)    Hgb urine dipstick SMALL (*)    Ketones, ur 80 (*)    Bacteria, UA RARE (*)    All other components within normal limits  POC OCCULT BLOOD, ED - Abnormal; Notable for the following components:   Fecal Occult Bld POSITIVE (*)    All other components within normal limits  URINE CULTURE  SARS CORONAVIRUS 2 (TAT 6-24 HRS)  LIPASE, BLOOD    EKG EKG Interpretation  Date/Time:  Thursday March 21 2019 08:12:34 EDT Ventricular Rate:  97 PR Interval:  174 QRS Duration: 164 QT Interval:  426 QTC Calculation: 541 R Axis:   -15 Text Interpretation:  Normal sinus rhythm Left bundle branch block Abnormal ECG Confirmed by Lennice Sites [phone]) on 03/21/2019 8:15:43 AM   Radiology Ct Abdomen Pelvis W Contrast  Result Date: 03/21/2019 CLINICAL DATA:  Right-sided abdominal pain. Area of palpable concern along the right chest wall. EXAM: CT ABDOMEN AND PELVIS WITH CONTRAST TECHNIQUE: Multidetector CT imaging of the abdomen and pelvis was performed using  the standard protocol following bolus administration of intravenous contrast. CONTRAST:  111mL OMNIPAQUE IOHEXOL 300 MG/ML  SOLN COMPARISON:  January 09, 2011 FINDINGS: Lower chest: Bilateral irregular pleural thickening with calcified plaques in the lung bases. Innumerable pleural/subpleural soft tissue nodules along the sub pulmonic peri diaphragmatic bilateral lower lobe pleura. 7 mm left middle lobe pulmonary nodule. Partially visualized 1.4 cm  right lower lobe peripheral pulmonary nodule. 9 mm left lower lobe peripheral pulmonary nodule and an additional 7 mm left lower lobe peripheral pulmonary nodule. Marked dilation of the distal esophagus with irregular fungating soft tissue thickening along the anterior wall of the esophagus, partially visualized due to collimation. Sub pathologic by CT criteria paraesophageal lymph nodes measuring up to 6 mm in short axis, likely malignant. Mildly enlarged heart. No pericardial effusion. Gynecomastia. Hepatobiliary: Several, some confluent soft tissue masses throughout the liver, with the largest in the dome of the liver measuring 7.2 x 6.2 cm. Pre-existing benign cyst in the inferior right lobe of the liver. Pancreas: Fatty replacement of the pancreas. No pancreatic ductal dilation. Spleen: Splenic granulomata. Adrenals/Urinary Tract: Adrenal glands are unremarkable. Kidneys are without renal calculi, focal lesion, or hydronephrosis. Exophytic right lower pole renal cyst measures 1.5 cm. The base of the urinary bladder is compressed by markedly enlarged prostate gland. Stomach/Bowel: Stomach is within normal limits. Appendix appears normal. No evidence of bowel wall thickening, distention, or inflammatory changes. Vascular/Lymphatic: Aortic atherosclerosis. No enlarged abdominal or pelvic lymph nodes. Nonspecific fatty retroperitoneal lymph nodes in the upper abdomen. Reproductive: Heterogeneous enlarged prostate gland measures 6.4 x 5.9 cm. Other: No abdominal wall hernia  or abnormality. No abdominopelvic ascites. Musculoskeletal: No acute or significant osseous findings. Mostly stable lytic lesion within T10 vertebral body. Its stability since 2012 supports benign etiology. IMPRESSION: 1. Marked dilation of the distal esophagus with irregular fungating soft tissue thickening along the anterior wall of the esophagus, partially visualized due to collimation. Sub pathologic by CT criteria paraesophageal lymph nodes measuring up to 6 mm in short axis, likely malignant. 2. Several, some confluent soft tissue masses throughout the liver, with the largest in the dome of the liver measuring 7.2 x 6.2 cm. These findings are highly suspicious for metastatic disease. 3. Bilateral irregular pleural thickening with calcified pleural plaques in the lung bases. Innumerable pleural/subpleural soft tissue nodules along the sub pulmonic peri diaphragmatic bilateral lower lobe pleura. These may represent pleural-based metastases, or primary tumors. 4. Heterogeneous enlarged prostate gland. The base of the urinary bladder is compressed by markedly enlarged prostate gland. Please correlate to serum PSA values. Aortic Atherosclerosis (ICD10-I70.0). These results were called by telephone at the time of interpretation on 03/21/2019 at 11:38 am to provider [facility] who verbally acknowledged these results. Electronically Signed   By: Fidela Salisbury M.D.   On: 03/21/2019 11:41   Dg Chest Portable 1 View  Result Date: 03/21/2019 CLINICAL DATA:  Tachypnea and cough EXAM: PORTABLE CHEST 1 VIEW COMPARISON:  01/16/2017 FINDINGS: Chronic bilateral pulmonary opacification from calcified pleural plaques which are extensive. No acute superimposed airspace disease or edema. Borderline heart size. No effusion or pneumothorax. IMPRESSION: No acute finding when accounting for asbestos related pleural disease. Electronically Signed   By: Monte Fantasia M.D.   On: 03/21/2019 08:58    Procedures  Procedures (including critical care time)  Medications Ordered in ED Medications  fentaNYL (SUBLIMAZE) injection 50 mcg (50 mcg Intravenous Given 03/21/19 0855)  pantoprazole (PROTONIX) injection 40 mg (40 mg Intravenous Given 03/21/19 1002)  sterile water (preservative free) injection (8 mLs  Given 03/21/19 1002)  iohexol (OMNIPAQUE) 300 MG/ML solution 80 mL (100 mLs Intravenous Contrast Given 03/21/19 1037)     Initial Impression / Assessment and Plan / ED Course  I have reviewed the triage vital signs and the nursing notes.  Pertinent labs & imaging results that were available during my care of  the patient were reviewed by me and considered in my medical decision making (see chart for details).       Eric Evans is a 84 y.o. male who presents to ED for acute onset of right-sided abdominal pain this morning, however has been generally unwell for quite some time now. His main difficulty is decreased PO intake. For the last 3 weeks, he has been unable to tolerate solids and mostly eating broths and liquids. This has led to worsening weakness and fatigue. Wife is very concerned about his worsening condition. Seen in ED for bloody stools on 10/09. Chart reviewed from this encounter. He was referred to GI and actually had an appointment for upper gi series today, but his new abdominal pain led him to er instead. On exam, he is afebrile, hemodynamically stable with right-sided abdominal tenderness.  Hemoccult positive, but light brown stool and culture.  Labs reviewed.  Does have slight worsening in his liver enzymes and bili.  Unfortunately, CT scan shows several soft tissue masses throughout the liver concerning for metastatic disease as well as dilation of the distal esophagus with irregular fungating soft tissue thickening concerning for malignancy as well.  Several other findings as per radiology imaging above.  Discussed with [facility] gastroenterology who will evaluate patient.  Discussed  with medicine who will admit.  Patient seen by and discussed with Dr. Ronnald Nian who agrees with treatment plan.    Final Clinical Impressions(s) / ED Diagnoses   Final diagnoses:  Esophageal mass    ED Discharge Orders    None       Ward, Ozella Almond, PA-C 03/21/19 [place], [place], DO 03/21/19 1637

## 2019-03-21 NOTE — H&P (View-Only) (Signed)
[facility] Consult  Referring Provider: Lennice Sites, DO/ER Primary Care Physician:  Patient, No Pcp Per Primary Gastroenterologist: Dr.Magod  Reason for Consultation: Dysphagia, esophageal mass  HPI: Eric Evans is a 84 y.o. male presented to the ED with complaints of right-sided abdominal pain.  He had an audio visit with Dr. Barrie Dunker 2 days ago for dysphagia and was scheduled for an upper GI series today, however due to severe right-sided abdominal pain he presented to the ED and had a CAT scan which showed mild dilation of distal esophagus with irregular fungating soft tissue thickening along anterior wall of esophagus with paraesophageal lymph nodes likely malignant.  Patient also noted to have several soft tissue masses throughout the liver, largest measuring 7.2 cm suspicious for metastatic disease along with pleural-based metastases of primary tumors in the lungs. Patient is hard of hearing and according to the patient and his wife present at bedside he has had ongoing difficulty swallowing solids more than liquids for the past 3 weeks.  For the past 2 weeks he has only stayed on liquid diet.  Few days ago he presented to [facility] ER with black stools with concerns for upper GI bleed and was discharged on Pepcid and a PPI. No prior EGD or a colonoscopy. Patient has had cervical pain and uses a neck support since 1990s.  In the past he has had a feeding tube placed which was subsequently removed after full recovery and nursing home placement in 2011.  Today morning, he developed severe right upper quadrant abdominal pain which felt like a large lump that was moving, reports that any movement would cause worsening of pain.   Past Medical History:  Diagnosis Date  . CHF (congestive heart failure) ([place])   . Hypertension     Past Surgical History:  Procedure Laterality Date  . CERVICAL SPINE SURGERY      Prior to Admission medications   Medication Sig Start Date End  Date Taking? Authorizing Provider  famotidine (PEPCID) 20 MG tablet Take 1 tablet (20 mg total) by mouth 2 (two) times daily. Take before breakfast and before dinner 03/15/19 05/14/19 Yes Trifan, Carola Rhine, MD  omeprazole (PRILOSEC) 20 MG capsule Take 1 capsule (20 mg total) by mouth daily. Take before breakfast 03/15/19 05/14/19 Yes Trifan, Carola Rhine, MD  albuterol (PROVENTIL HFA;VENTOLIN HFA) 108 (90 Base) MCG/ACT inhaler Inhale 1-2 puffs into the lungs every 6 (six) hours as needed for wheezing or shortness of breath. Patient not taking: Reported on 03/21/2019 01/16/17   Rancour, Annie Main, MD  furosemide (LASIX) 20 MG tablet Take 1 tablet daily as needed for swelling. Patient not taking: Reported on 03/21/2019 07/19/17   Molpus, Jenny Reichmann, MD    Current Facility-Administered Medications  Medication Dose Route Frequency Provider Last Rate Last Dose  . 0.9 %  sodium chloride infusion   Intravenous Continuous Kathi Ludwig, MD 75 mL/hr at 03/21/19 1548    . acetaminophen (TYLENOL) tablet 650 mg  650 mg Oral Q6H PRN Kathi Ludwig, MD       Or  . acetaminophen (TYLENOL) suppository 650 mg  650 mg Rectal Q6H PRN Kathi Ludwig, MD      . enoxaparin (LOVENOX) injection 40 mg  40 mg Subcutaneous Q24H Harbrecht, Lawrence, MD      . morphine 2 MG/ML injection 2-4 mg  2-4 mg Intravenous Q4H PRN Kathi Ludwig, MD        Allergies as of 03/21/2019  . (No Known Allergies)    No family  history on file.  Social History   Socioeconomic History  . Marital status: Married    Spouse name: Not on file  . Number of children: Not on file  . Years of education: Not on file  . Highest education level: Not on file  Occupational History  . Not on file  Social Needs  . Financial resource strain: Not on file  . Food insecurity    Worry: Not on file    Inability: Not on file  . Transportation needs    Medical: Not on file    Non-medical: Not on file  Tobacco Use  . Smoking status: Former  Research scientist (life sciences)  . Smokeless tobacco: Never Used  Substance and Sexual Activity  . Alcohol use: No  . Drug use: No  . Sexual activity: Not on file  Lifestyle  . Physical activity    Days per week: Not on file    Minutes per session: Not on file  . Stress: Not on file  Relationships  . Social Herbalist on phone: Not on file    Gets together: Not on file    Attends religious service: Not on file    Active member of club or organization: Not on file    Attends meetings of clubs or organizations: Not on file    Relationship status: Not on file  . Intimate partner violence    Fear of current or ex partner: Not on file    Emotionally abused: Not on file    Physically abused: Not on file    Forced sexual activity: Not on file  Other Topics Concern  . Not on file  Social History Narrative  . Not on file    Review of Systems: Positive for: GI: Described in detail in HPI.    Gen: Denies any fever, chills, rigors, night sweats, anorexia, fatigue, weakness, malaise, involuntary weight loss, and sleep disorder CV: Denies chest pain, angina, palpitations, syncope, orthopnea, PND, peripheral edema, and claudication. Resp: Denies dyspnea, cough, sputum, wheezing, coughing up blood. GU : Denies urinary burning, blood in urine, urinary frequency, urinary hesitancy, nocturnal urination, and urinary incontinence. MS: Neck pain, uses a neck support since 1990s Derm: Denies rash, itching, oral ulcerations, hives, unhealing ulcers.  Psych: Denies depression, anxiety, memory loss, suicidal ideation, hallucinations,  and confusion. Heme: Denies bruising, bleeding, and enlarged lymph nodes. Neuro:  Denies any headaches, dizziness, paresthesias. Endo:  Denies any problems with DM, thyroid, adrenal function.  Physical Exam: Vital signs in last 24 hours: Temp:  [97.7 F (36.5 C)-99.6 F (37.6 C)] 97.7 F (36.5 C) (10/15 1514) Pulse Rate:  [69-96] 89 (10/15 1514) Resp:  [15-31] 21 (10/15  1430) BP: (133-183)/(64-95) 174/77 (10/15 1514) SpO2:  [96 %-100 %] 98 % (10/15 1514) Weight:  [72.6 kg] 72.6 kg (10/15 0856)    General:   Alert,  Well-developed, thinly built , pleasant and cooperative in NAD Head:  Normocephalic and atraumatic. Eyes:  Sclera clear, no icterus.   Conjunctiva pink. Ears: Hard of hearing Nose:  No deformity, discharge,  or lesions. Mouth:  No deformity or lesions.  Oropharynx pink & moist. Neck: Neck support in place Lungs:  Clear throughout to auscultation.   No wheezes, crackles, or rhonchi. No acute distress. Heart:  Regular rate and rhythm; no murmurs, clicks, rubs,  or gallops. Extremities:  Without clubbing or edema. Neurologic:  Alert and  oriented x4;  grossly normal neurologically. Skin:  Intact without significant lesions or rashes. Psych:  Alert and cooperative. Normal mood and affect. Abdomen:  Soft, mild tenderness in right upper and right lower quadrant and nondistended. No masses, hepatosplenomegaly or hernias noted. Normal bowel sounds, without guarding, and without rebound.         Lab Results: Recent Labs    03/21/19 0848  WBC 11.9*  HGB 14.8  HCT 43.0  PLT 401*   BMET Recent Labs    03/21/19 0848  NA 139  K 3.3*  CL 98  CO2 20*  GLUCOSE 112*  BUN 13  CREATININE 1.14  CALCIUM 9.1   LFT Recent Labs    03/21/19 0848  PROT 7.0  ALBUMIN 3.2*  AST 89*  ALT 49*  ALKPHOS 217*  BILITOT 2.3*   PT/INR No results for input(s): LABPROT, INR in the last 72 hours.  Studies/Results: Ct Abdomen Pelvis W Contrast  Result Date: 03/21/2019 CLINICAL DATA:  Right-sided abdominal pain. Area of palpable concern along the right chest wall. EXAM: CT ABDOMEN AND PELVIS WITH CONTRAST TECHNIQUE: Multidetector CT imaging of the abdomen and pelvis was performed using the standard protocol following bolus administration of intravenous contrast. CONTRAST:  169mL OMNIPAQUE IOHEXOL 300 MG/ML  SOLN COMPARISON:  January 09, 2011 FINDINGS:  Lower chest: Bilateral irregular pleural thickening with calcified plaques in the lung bases. Innumerable pleural/subpleural soft tissue nodules along the sub pulmonic peri diaphragmatic bilateral lower lobe pleura. 7 mm left middle lobe pulmonary nodule. Partially visualized 1.4 cm right lower lobe peripheral pulmonary nodule. 9 mm left lower lobe peripheral pulmonary nodule and an additional 7 mm left lower lobe peripheral pulmonary nodule. Marked dilation of the distal esophagus with irregular fungating soft tissue thickening along the anterior wall of the esophagus, partially visualized due to collimation. Sub pathologic by CT criteria paraesophageal lymph nodes measuring up to 6 mm in short axis, likely malignant. Mildly enlarged heart. No pericardial effusion. Gynecomastia. Hepatobiliary: Several, some confluent soft tissue masses throughout the liver, with the largest in the dome of the liver measuring 7.2 x 6.2 cm. Pre-existing benign cyst in the inferior right lobe of the liver. Pancreas: Fatty replacement of the pancreas. No pancreatic ductal dilation. Spleen: Splenic granulomata. Adrenals/Urinary Tract: Adrenal glands are unremarkable. Kidneys are without renal calculi, focal lesion, or hydronephrosis. Exophytic right lower pole renal cyst measures 1.5 cm. The base of the urinary bladder is compressed by markedly enlarged prostate gland. Stomach/Bowel: Stomach is within normal limits. Appendix appears normal. No evidence of bowel wall thickening, distention, or inflammatory changes. Vascular/Lymphatic: Aortic atherosclerosis. No enlarged abdominal or pelvic lymph nodes. Nonspecific fatty retroperitoneal lymph nodes in the upper abdomen. Reproductive: Heterogeneous enlarged prostate gland measures 6.4 x 5.9 cm. Other: No abdominal wall hernia or abnormality. No abdominopelvic ascites. Musculoskeletal: No acute or significant osseous findings. Mostly stable lytic lesion within T10 vertebral body. Its  stability since 2012 supports benign etiology. IMPRESSION: 1. Marked dilation of the distal esophagus with irregular fungating soft tissue thickening along the anterior wall of the esophagus, partially visualized due to collimation. Sub pathologic by CT criteria paraesophageal lymph nodes measuring up to 6 mm in short axis, likely malignant. 2. Several, some confluent soft tissue masses throughout the liver, with the largest in the dome of the liver measuring 7.2 x 6.2 cm. These findings are highly suspicious for metastatic disease. 3. Bilateral irregular pleural thickening with calcified pleural plaques in the lung bases. Innumerable pleural/subpleural soft tissue nodules along the sub pulmonic peri diaphragmatic bilateral lower lobe pleura. These may represent pleural-based metastases, or primary  tumors. 4. Heterogeneous enlarged prostate gland. The base of the urinary bladder is compressed by markedly enlarged prostate gland. Please correlate to serum PSA values. Aortic Atherosclerosis (ICD10-I70.0). These results were called by telephone at the time of interpretation on 03/21/2019 at 11:38 am to provider [facility] who verbally acknowledged these results. Electronically Signed   By: Fidela Salisbury M.D.   On: 03/21/2019 11:41   Dg Chest Portable 1 View  Result Date: 03/21/2019 CLINICAL DATA:  Tachypnea and cough EXAM: PORTABLE CHEST 1 VIEW COMPARISON:  01/16/2017 FINDINGS: Chronic bilateral pulmonary opacification from calcified pleural plaques which are extensive. No acute superimposed airspace disease or edema. Borderline heart size. No effusion or pneumothorax. IMPRESSION: No acute finding when accounting for asbestos related pleural disease. Electronically Signed   By: Monte Fantasia M.D.   On: 03/21/2019 08:58    Impression: Dysphagia for solids more than liquids  Esophageal mass with lymph nodes, liver masses, lung masses noted suspicious for metastatic disease Elevated LFTs total bili  2.3/AST 89/ALT 49/ALP 217 likely related to liver masses Hyponatremia, mild acidosis  Plan: Plan EGD tomorrow with biopsies for diagnosis with anesthesia, timing depends on anesthesia availability. The risks and the benefits of the procedure were discussed with the patient in detail.  He understands and verbalizes consent. Patient is currently on normal saline at 75 ml/hour, Lovenox to be given at 9 PM today and will keep on Protonix 40 mg twice daily.   LOS: 0 days   Ronnette Juniper, MD  03/21/2019, 4:39 PM

## 2019-03-21 NOTE — H&P (Signed)
Date: 03/21/2019               Patient Name:  Eric Evans MRN: [id number]  DOB: Aug 25, 1934 Age / Sex: 84 y.o., male   PCP: Patient, No Pcp Per         Medical Service: Internal Medicine Teaching Service         Attending Physician: Dr. Lynnae January, Real Cons, MD    First Contact: Dr. Charleen Kirks Pager: [phone]  Second Contact: Dr. 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   Eric Evans reports that his dysphagia began several weeks ago but significantly worsened over the last 3 weeks, which time he began to have difficulty swallowing solid food such as grilled cheese. Then approximately 1 week ago, he stared to having difficulty with even liquids and soft food such as jello.  In addition, he attempted to drink Gatorade at one point as well but noticed that this caused a severe burning sensation in his chest. He endorsed a similar sensation on and off since that time, and sometimes wakes him up from his sleep. Tries to relieve it with ice application but it doesn't work. In the last week, he has also developed severe RUQ abdominal pain with a large lump that moves. He denies any nausea, vomiting, or abdominal pain radiation. Movement makes the abdominal pain worse. No alleviating factors. He does note that 1 week ago, he did have multiple bowel movements that were black. He came to the ED and was worked up for it. He was referred to Dr. Watt Climes for a upper GI study but due to RUQ pain, decided to come to the ED again today. He also endorses occasional choking that leads to  coughing and SOB. He denies fever, chills, diaphoresis, night sweats, BRBPR. Patient's wife endorses that she feel as if the patient has lost a significant amount of weight since he stopped eating 3 weeks ago but not prior to that.   Eric Evans stated that he first noticed difficulty with swallowing in 2011 but did not undergo complete testing at that time. He underwent a swallowing evaluation with some sort of GI contrast solution but does not recall being told a diagnosis. His wife notes that he was very depressed at the time due to years of cervical pain. He did have a PEG tube placed and was sent to SNF for 35 days. He was able to tolerate removal of PEG and recovered fully.   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 He was told he had asbestos related imaging findings on plain films in the late 90's.  Meds: Current Meds  Medication Sig   famotidine (PEPCID) 20 MG tablet Take 1 tablet (20 mg total) by mouth 2 (two) times daily. Take before breakfast and before dinner   omeprazole (PRILOSEC) 20 MG capsule Take 1 capsule (20 mg total) by mouth daily. Take before breakfast   Allergies: Allergies as  of 03/21/2019   (No Known Allergies)   Past Medical History:  Diagnosis Date   CHF (congestive heart failure) ([place])    Hypertension   - Prior feeding tube due to dysphagia in 2011 at [place]. Homehealth assisted with having him admitted to a Rehab facility. Diagnosis uncertain.  [facility]  Family History:  Mother-Heart failure  Father-unknown Step brother-liver Ca and T2DM Maternal Uncles: Cancer, deceased from, unknown type  Social History:  Tobacco-smoked 1ppd stopped >30 years prior EtOH: Socially used, use stopped drinking about 30 years prior Denied illicit substance use disorder  Review of Systems: A complete ROS was negative except as per  HPI.   Physical Exam: Blood pressure (!) 160/78, pulse 78, temperature 99.6 F (37.6 C), temperature source Rectal, resp. rate (!) 24, height 5\' 4"  (1.626 m), weight 72.6 kg, SpO2 96 %.  Physical Exam Vitals signs and nursing note reviewed.  Constitutional:      Appearance: He is normal weight.  HENT:     Head: Normocephalic and atraumatic.     Right Ear: Decreased hearing noted.     Left Ear: Decreased hearing noted.  Cardiovascular:     Rate and Rhythm: Normal rate and regular rhythm.     Heart sounds: Gallop present.   Pulmonary:     Effort: Pulmonary effort is normal. No respiratory distress.     Breath sounds: Normal breath sounds. No wheezing, rhonchi or rales.  Abdominal:     General: A surgical scar is present. Bowel sounds are normal. There is distension (Mostly on the right side). There are no signs of injury.     Tenderness: There is abdominal tenderness in the right upper quadrant and right lower quadrant. There is right CVA tenderness. There is no guarding.  Musculoskeletal:     Right lower leg: No edema.     Left lower leg: No edema.  Skin:    General: Skin is warm and dry.     Coloration: Skin is not jaundiced.     Findings: No rash.  Neurological:     General: No focal deficit present.     Mental Status: He is alert and oriented to person, place, and time.  Psychiatric:        Mood and Affect: Mood normal.        Behavior: Behavior normal.    EKG: personally reviewed my interpretation is NSR with LBBB and prolonged PR interval   CXR: personally reviewed my interpretation is bilateral calcified pleural plaques consistent with probable asbestos exposure.   CT Abd/Pelvis:  Impression: 1. Marked dilation of the distal esophagus with irregular fungating soft tissue thickening along the anterior wall of the esophagus, partially visualized due to collimation. Sub pathologic by CT criteria paraesophageal lymph nodes measuring up to 6 mm in short axis, likely  malignant. 2. Several, some confluent soft tissue masses throughout the liver, with the largest in the dome of the liver measuring 7.2 x 6.2 cm. These findings are highly suspicious for metastatic disease. 3. Bilateral irregular pleural thickening with calcified pleural plaques in the lung bases. Innumerable pleural/subpleural soft tissue nodules along the sub pulmonic peri diaphragmatic bilateral lower lobe pleura. These may represent pleural-based metastases, or primary tumors. 4. Heterogeneous enlarged prostate gland. The base of the urinary bladder is compressed by markedly enlarged prostate gland. Please correlate to serum PSA values.  Assessment & Plan by Problem: Active Problems:   Esophageal mass  Eric Evans is an 84 yo M w/ PMHx significant  for asbestos exposure and pleural plaques who presented to the ED with acute onset right sided abdominal pain which began early this morning and progressive dysphagia for over a week, as well as melena for 1 week. His CT is exceedingly concerning for malignancy which is supported by his history. As far as the primary source this could be esophageal from uncontrolled GERD vs 2/2 to his asbestos exposure.   # Esophageal mass: # Liver Masses:   Extremely concerning for malignancy given widespread involvement, large size and irregular shape. With the CT results, it seems the esophageal mass may be the primary with metastasis to the liver. When discussed with patient and his family, they do want to try to treat. It may still be a good idea to consider palliative care though, as unlikely this can be cured. GI is on board and plans for EGD with biopsy tomorrow. Oncology consult pending biopsy results.   - GI is on board and we appreciate their recommendations  - NPO  - CBC and CMP - IVF @ 75 cc/hr  - Morphine 2-4 q4h PRN  # GI Bleed:  Unknown source at this time, but likely related to his numerous widespread masses. His hemoglobin has remained  stable, so likely a very slow bleed. GI is on board for a EGD tomorrow for esophageal mass. Will follow up results if sources of upper GI bleed are noted. If negative, will continue lower GI bleed work up as needed.   - CBC  # Multiple Pleural/Subpleural nodules:  This may be secondary to previous asbestos exposure, vs metastasis from esophagus/liver, vs an additional primary cancer.   - Continue to monitor for now until masses listed above are evaluated further   # Prostate Enlargement:  # Hematuria CT notable for heterogeneous prostate measuring 6.4 x 5.9 cm. Appears to be compressing the lower part of the bladder. Will need to investigate further regarding urinary symptoms. PSA was recommended by the radiologist but will hold off given patient has masses all over his abdomen that are more urgent.   - Monitor for urinary obstruction - If no urine output, bladder scan and in/out if needed  # Hypokalemia Likely due to decreased PO intake. Will replenish as needed  - KCl 30 mEq IV  - BMP  # Transaminitis # Elevated Bilirubin Mildly elevated AST and ALT in a 2:1 ratio. Likely secondary to multiple hepatic masses, but will trend LFTs  - Hepatic panel with BMP    Diet: NPO Code: DNR/DNI DVT PPX: Enoxaparin Dispo: Admit patient to Inpatient with expected length of stay greater than 2 midnights.  Signed: Dr. Jose Persia Internal Medicine PGY-1  Pager: [phone] 03/21/2019, 1:33 PM

## 2019-03-21 NOTE — Consult Note (Addendum)
[facility] Consult  Referring Provider: Lennice Sites, DO/ER Primary Care Physician:  Patient, No Pcp Per Primary Gastroenterologist: Dr.Magod  Reason for Consultation: Dysphagia, esophageal mass  HPI: Eric Evans is a 84 y.o. male presented to the ED with complaints of right-sided abdominal pain.  He had an audio visit with Dr. Barrie Dunker 2 days ago for dysphagia and was scheduled for an upper GI series today, however due to severe right-sided abdominal pain he presented to the ED and had a CAT scan which showed mild dilation of distal esophagus with irregular fungating soft tissue thickening along anterior wall of esophagus with paraesophageal lymph nodes likely malignant.  Patient also noted to have several soft tissue masses throughout the liver, largest measuring 7.2 cm suspicious for metastatic disease along with pleural-based metastases of primary tumors in the lungs. Patient is hard of hearing and according to the patient and his wife present at bedside he has had ongoing difficulty swallowing solids more than liquids for the past 3 weeks.  For the past 2 weeks he has only stayed on liquid diet.  Few days ago he presented to [facility] ER with black stools with concerns for upper GI bleed and was discharged on Pepcid and a PPI. No prior EGD or a colonoscopy. Patient has had cervical pain and uses a neck support since 1990s.  In the past he has had a feeding tube placed which was subsequently removed after full recovery and nursing home placement in 2011.  Today morning, he developed severe right upper quadrant abdominal pain which felt like a large lump that was moving, reports that any movement would cause worsening of pain.   Past Medical History:  Diagnosis Date  . CHF (congestive heart failure) ([place])   . Hypertension     Past Surgical History:  Procedure Laterality Date  . CERVICAL SPINE SURGERY      Prior to Admission medications   Medication Sig Start Date End  Date Taking? Authorizing Provider  famotidine (PEPCID) 20 MG tablet Take 1 tablet (20 mg total) by mouth 2 (two) times daily. Take before breakfast and before dinner 03/15/19 05/14/19 Yes Trifan, Carola Rhine, MD  omeprazole (PRILOSEC) 20 MG capsule Take 1 capsule (20 mg total) by mouth daily. Take before breakfast 03/15/19 05/14/19 Yes Trifan, Carola Rhine, MD  albuterol (PROVENTIL HFA;VENTOLIN HFA) 108 (90 Base) MCG/ACT inhaler Inhale 1-2 puffs into the lungs every 6 (six) hours as needed for wheezing or shortness of breath. Patient not taking: Reported on 03/21/2019 01/16/17   Rancour, Annie Main, MD  furosemide (LASIX) 20 MG tablet Take 1 tablet daily as needed for swelling. Patient not taking: Reported on 03/21/2019 07/19/17   Molpus, Jenny Reichmann, MD    Current Facility-Administered Medications  Medication Dose Route Frequency Provider Last Rate Last Dose  . 0.9 %  sodium chloride infusion   Intravenous Continuous Kathi Ludwig, MD 75 mL/hr at 03/21/19 1548    . acetaminophen (TYLENOL) tablet 650 mg  650 mg Oral Q6H PRN Kathi Ludwig, MD       Or  . acetaminophen (TYLENOL) suppository 650 mg  650 mg Rectal Q6H PRN Kathi Ludwig, MD      . enoxaparin (LOVENOX) injection 40 mg  40 mg Subcutaneous Q24H Harbrecht, Lawrence, MD      . morphine 2 MG/ML injection 2-4 mg  2-4 mg Intravenous Q4H PRN Kathi Ludwig, MD        Allergies as of 03/21/2019  . (No Known Allergies)    No family  history on file.  Social History   Socioeconomic History  . Marital status: Married    Spouse name: Not on file  . Number of children: Not on file  . Years of education: Not on file  . Highest education level: Not on file  Occupational History  . Not on file  Social Needs  . Financial resource strain: Not on file  . Food insecurity    Worry: Not on file    Inability: Not on file  . Transportation needs    Medical: Not on file    Non-medical: Not on file  Tobacco Use  . Smoking status: Former  Research scientist (life sciences)  . Smokeless tobacco: Never Used  Substance and Sexual Activity  . Alcohol use: No  . Drug use: No  . Sexual activity: Not on file  Lifestyle  . Physical activity    Days per week: Not on file    Minutes per session: Not on file  . Stress: Not on file  Relationships  . Social Herbalist on phone: Not on file    Gets together: Not on file    Attends religious service: Not on file    Active member of club or organization: Not on file    Attends meetings of clubs or organizations: Not on file    Relationship status: Not on file  . Intimate partner violence    Fear of current or ex partner: Not on file    Emotionally abused: Not on file    Physically abused: Not on file    Forced sexual activity: Not on file  Other Topics Concern  . Not on file  Social History Narrative  . Not on file    Review of Systems: Positive for: GI: Described in detail in HPI.    Gen: Denies any fever, chills, rigors, night sweats, anorexia, fatigue, weakness, malaise, involuntary weight loss, and sleep disorder CV: Denies chest pain, angina, palpitations, syncope, orthopnea, PND, peripheral edema, and claudication. Resp: Denies dyspnea, cough, sputum, wheezing, coughing up blood. GU : Denies urinary burning, blood in urine, urinary frequency, urinary hesitancy, nocturnal urination, and urinary incontinence. MS: Neck pain, uses a neck support since 1990s Derm: Denies rash, itching, oral ulcerations, hives, unhealing ulcers.  Psych: Denies depression, anxiety, memory loss, suicidal ideation, hallucinations,  and confusion. Heme: Denies bruising, bleeding, and enlarged lymph nodes. Neuro:  Denies any headaches, dizziness, paresthesias. Endo:  Denies any problems with DM, thyroid, adrenal function.  Physical Exam: Vital signs in last 24 hours: Temp:  [97.7 F (36.5 C)-99.6 F (37.6 C)] 97.7 F (36.5 C) (10/15 1514) Pulse Rate:  [69-96] 89 (10/15 1514) Resp:  [15-31] 21 (10/15  1430) BP: (133-183)/(64-95) 174/77 (10/15 1514) SpO2:  [96 %-100 %] 98 % (10/15 1514) Weight:  [72.6 kg] 72.6 kg (10/15 0856)    General:   Alert,  Well-developed, thinly built , pleasant and cooperative in NAD Head:  Normocephalic and atraumatic. Eyes:  Sclera clear, no icterus.   Conjunctiva pink. Ears: Hard of hearing Nose:  No deformity, discharge,  or lesions. Mouth:  No deformity or lesions.  Oropharynx pink & moist. Neck: Neck support in place Lungs:  Clear throughout to auscultation.   No wheezes, crackles, or rhonchi. No acute distress. Heart:  Regular rate and rhythm; no murmurs, clicks, rubs,  or gallops. Extremities:  Without clubbing or edema. Neurologic:  Alert and  oriented x4;  grossly normal neurologically. Skin:  Intact without significant lesions or rashes. Psych:  Alert and cooperative. Normal mood and affect. Abdomen:  Soft, mild tenderness in right upper and right lower quadrant and nondistended. No masses, hepatosplenomegaly or hernias noted. Normal bowel sounds, without guarding, and without rebound.         Lab Results: Recent Labs    03/21/19 0848  WBC 11.9*  HGB 14.8  HCT 43.0  PLT 401*   BMET Recent Labs    03/21/19 0848  NA 139  K 3.3*  CL 98  CO2 20*  GLUCOSE 112*  BUN 13  CREATININE 1.14  CALCIUM 9.1   LFT Recent Labs    03/21/19 0848  PROT 7.0  ALBUMIN 3.2*  AST 89*  ALT 49*  ALKPHOS 217*  BILITOT 2.3*   PT/INR No results for input(s): LABPROT, INR in the last 72 hours.  Studies/Results: Ct Abdomen Pelvis W Contrast  Result Date: 03/21/2019 CLINICAL DATA:  Right-sided abdominal pain. Area of palpable concern along the right chest wall. EXAM: CT ABDOMEN AND PELVIS WITH CONTRAST TECHNIQUE: Multidetector CT imaging of the abdomen and pelvis was performed using the standard protocol following bolus administration of intravenous contrast. CONTRAST:  149mL OMNIPAQUE IOHEXOL 300 MG/ML  SOLN COMPARISON:  January 09, 2011 FINDINGS:  Lower chest: Bilateral irregular pleural thickening with calcified plaques in the lung bases. Innumerable pleural/subpleural soft tissue nodules along the sub pulmonic peri diaphragmatic bilateral lower lobe pleura. 7 mm left middle lobe pulmonary nodule. Partially visualized 1.4 cm right lower lobe peripheral pulmonary nodule. 9 mm left lower lobe peripheral pulmonary nodule and an additional 7 mm left lower lobe peripheral pulmonary nodule. Marked dilation of the distal esophagus with irregular fungating soft tissue thickening along the anterior wall of the esophagus, partially visualized due to collimation. Sub pathologic by CT criteria paraesophageal lymph nodes measuring up to 6 mm in short axis, likely malignant. Mildly enlarged heart. No pericardial effusion. Gynecomastia. Hepatobiliary: Several, some confluent soft tissue masses throughout the liver, with the largest in the dome of the liver measuring 7.2 x 6.2 cm. Pre-existing benign cyst in the inferior right lobe of the liver. Pancreas: Fatty replacement of the pancreas. No pancreatic ductal dilation. Spleen: Splenic granulomata. Adrenals/Urinary Tract: Adrenal glands are unremarkable. Kidneys are without renal calculi, focal lesion, or hydronephrosis. Exophytic right lower pole renal cyst measures 1.5 cm. The base of the urinary bladder is compressed by markedly enlarged prostate gland. Stomach/Bowel: Stomach is within normal limits. Appendix appears normal. No evidence of bowel wall thickening, distention, or inflammatory changes. Vascular/Lymphatic: Aortic atherosclerosis. No enlarged abdominal or pelvic lymph nodes. Nonspecific fatty retroperitoneal lymph nodes in the upper abdomen. Reproductive: Heterogeneous enlarged prostate gland measures 6.4 x 5.9 cm. Other: No abdominal wall hernia or abnormality. No abdominopelvic ascites. Musculoskeletal: No acute or significant osseous findings. Mostly stable lytic lesion within T10 vertebral body. Its  stability since 2012 supports benign etiology. IMPRESSION: 1. Marked dilation of the distal esophagus with irregular fungating soft tissue thickening along the anterior wall of the esophagus, partially visualized due to collimation. Sub pathologic by CT criteria paraesophageal lymph nodes measuring up to 6 mm in short axis, likely malignant. 2. Several, some confluent soft tissue masses throughout the liver, with the largest in the dome of the liver measuring 7.2 x 6.2 cm. These findings are highly suspicious for metastatic disease. 3. Bilateral irregular pleural thickening with calcified pleural plaques in the lung bases. Innumerable pleural/subpleural soft tissue nodules along the sub pulmonic peri diaphragmatic bilateral lower lobe pleura. These may represent pleural-based metastases, or primary  tumors. 4. Heterogeneous enlarged prostate gland. The base of the urinary bladder is compressed by markedly enlarged prostate gland. Please correlate to serum PSA values. Aortic Atherosclerosis (ICD10-I70.0). These results were called by telephone at the time of interpretation on 03/21/2019 at 11:38 am to provider [facility], [facility] who verbally acknowledged these results. Electronically Signed   By: Fidela Salisbury M.D.   On: 03/21/2019 11:41   Dg Chest Portable 1 View  Result Date: 03/21/2019 CLINICAL DATA:  Tachypnea and cough EXAM: PORTABLE CHEST 1 VIEW COMPARISON:  01/16/2017 FINDINGS: Chronic bilateral pulmonary opacification from calcified pleural plaques which are extensive. No acute superimposed airspace disease or edema. Borderline heart size. No effusion or pneumothorax. IMPRESSION: No acute finding when accounting for asbestos related pleural disease. Electronically Signed   By: Monte Fantasia M.D.   On: 03/21/2019 08:58    Impression: Dysphagia for solids more than liquids  Esophageal mass with lymph nodes, liver masses, lung masses noted suspicious for metastatic disease Elevated LFTs total bili  2.3/AST 89/ALT 49/ALP 217 likely related to liver masses Hyponatremia, mild acidosis  Plan: Plan EGD tomorrow with biopsies for diagnosis with anesthesia, timing depends on anesthesia availability. The risks and the benefits of the procedure were discussed with the patient in detail.  He understands and verbalizes consent. Patient is currently on normal saline at 75 ml/hour, Lovenox to be given at 9 PM today and will keep on Protonix 40 mg twice daily.   LOS: 0 days   Ronnette Juniper, MD  03/21/2019, 4:39 PM

## 2019-03-21 NOTE — Plan of Care (Signed)

## 2019-03-21 NOTE — ED Notes (Signed)
Admitting provider at bedside.

## 2019-03-21 NOTE — ED Notes (Signed)
Pt aware we need urine sample.  

## 2019-03-21 NOTE — ED Triage Notes (Signed)
Pt arrives via POV from home with CC of lump on right side. Tachypnenic in triage. Denies recent fever. Pt poor historian. Pt alert, oriented x4. VSS.

## 2019-03-21 NOTE — ED Notes (Signed)
ED TO INPATIENT HANDOFF REPORT  ED Nurse Name and Phone #: Percell Locus, RN  S Name/Age/Gender Eric Evans 84 y.o. male Room/Bed: 010C/010C  Code Status   Code Status: Not on file  Home/SNF/Other Home Patient oriented to: self, place, time and situation Is this baseline? Yes   Triage Complete: Triage complete  Chief Complaint KNOT ON SIDE  Triage Note Pt arrives via POV from home with CC of lump on right side. Tachypnenic in triage. Denies recent fever. Pt poor historian. Pt alert, oriented x4. VSS.    Allergies No Known Allergies  Level of Care/Admitting Diagnosis ED Disposition    ED Disposition Condition Comment   Admit  Hospital Area: [facility] [[id number]]  Level of Care: Med-Surg [16]  Covid Evaluation: Asymptomatic Screening Protocol (No Symptoms)  Diagnosis: Esophageal mass [id number]  Admitting Physician: Aline Brochure  Attending Physician: Bartholomew Crews [phone]  Estimated length of stay: past midnight tomorrow  Certification:: I certify this patient will need inpatient services for at least 2 midnights  PT Class (Do Not Modify): Inpatient [101]  PT Acc Code (Do Not Modify): Private [1]       B Medical/Surgery History Past Medical History:  Diagnosis Date  . CHF (congestive heart failure) ([place])   . Hypertension    Past Surgical History:  Procedure Laterality Date  . CERVICAL SPINE SURGERY       A IV Location/Drains/Wounds Patient Lines/Drains/Airways Status   Active Line/Drains/Airways    Name:   Placement date:   Placement time:   Site:   Days:   Peripheral IV 03/21/19 Right Antecubital   03/21/19    0841    Antecubital   less than 1          Intake/Output Last 24 hours No intake or output data in the 24 hours ending 03/21/19 1446  Labs/Imaging Results for orders placed or performed during the hospital encounter of 03/21/19 (from the past 48 hour(s))  Comprehensive metabolic panel     Status: Abnormal    Collection Time: 03/21/19  8:48 AM  Result Value Ref Range   Sodium 139 135 - 145 mmol/L   Potassium 3.3 (L) 3.5 - 5.1 mmol/L   Chloride 98 98 - 111 mmol/L   CO2 20 (L) 22 - 32 mmol/L   Glucose, Bld 112 (H) 70 - 99 mg/dL   BUN 13 8 - 23 mg/dL   Creatinine, Ser 1.14 0.61 - 1.24 mg/dL   Calcium 9.1 8.9 - 10.3 mg/dL   Total Protein 7.0 6.5 - 8.1 g/dL   Albumin 3.2 (L) 3.5 - 5.0 g/dL   AST 89 (H) 15 - 41 U/L   ALT 49 (H) 0 - 44 U/L   Alkaline Phosphatase 217 (H) 38 - 126 U/L   Total Bilirubin 2.3 (H) 0.3 - 1.2 mg/dL   GFR calc non Af Amer 60 (L) >60 mL/min   GFR calc Af Amer >60 >60 mL/min   Anion gap 21 (H) 5 - 15    Comment: Performed at [facility], [address] [place], [place] [address]  CBC with Differential     Status: Abnormal   Collection Time: 03/21/19  8:48 AM  Result Value Ref Range   WBC 11.9 (H) 4.0 - 10.5 K/uL   RBC 5.08 4.22 - 5.81 MIL/uL   Hemoglobin 14.8 13.0 - 17.0 g/dL   HCT 43.0 39.0 - 52.0 %   MCV 84.6 80.0 - 100.0 fL  MCH 29.1 26.0 - 34.0 pg   MCHC 34.4 30.0 - 36.0 g/dL   RDW 14.6 11.5 - 15.5 %   Platelets 401 (H) 150 - 400 K/uL   Neutrophils Relative % 81 %   Neutro Abs 9.7 (H) 1.7 - 7.7 K/uL   Lymphocytes Relative 8 %   Lymphs Abs 0.9 0.7 - 4.0 K/uL   Monocytes Relative 9 %   Monocytes Absolute 1.1 (H) 0.1 - 1.0 K/uL   Eosinophils Relative 1 %   Eosinophils Absolute 0.1 0.0 - 0.5 K/uL   Basophils Relative 0 %   Basophils Absolute 0.0 0.0 - 0.1 K/uL    Comment: Performed at [facility] [address] [place], [place] [address]  Lipase, blood     Status: None   Collection Time: 03/21/19  8:48 AM  Result Value Ref Range   Lipase 32 11 - 51 U/L    Comment: Performed at [facility] [address] [place], [place] [address]  POC occult blood, ED     Status: Abnormal   Collection Time: 03/21/19  9:21 AM  Result Value Ref Range   Fecal Occult Bld POSITIVE (A) NEGATIVE  Urinalysis, Routine w reflex microscopic     Status:  Abnormal   Collection Time: 03/21/19 12:05 PM  Result Value Ref Range   Color, Urine YELLOW YELLOW   APPearance CLEAR CLEAR   Specific Gravity, Urine >1.046 (H) 1.005 - 1.030   pH 5.0 5.0 - 8.0   Glucose, UA NEGATIVE NEGATIVE mg/dL   Hgb urine dipstick SMALL (A) NEGATIVE   Bilirubin Urine NEGATIVE NEGATIVE   Ketones, ur 80 (A) NEGATIVE mg/dL   Protein, ur NEGATIVE NEGATIVE mg/dL   Nitrite NEGATIVE NEGATIVE   Leukocytes,Ua NEGATIVE NEGATIVE   RBC / HPF 6-10 0 - 5 RBC/hpf   WBC, UA 6-10 0 - 5 WBC/hpf   Bacteria, UA RARE (A) NONE SEEN   Squamous Epithelial / LPF 0-5 0 - 5   Mucus PRESENT     Comment: Performed at [facility], [address], [place], [place] [address]   Ct Abdomen Pelvis W Contrast  Result Date: 03/21/2019 CLINICAL DATA:  Right-sided abdominal pain. Area of palpable concern along the right chest wall. EXAM: CT ABDOMEN AND PELVIS WITH CONTRAST TECHNIQUE: Multidetector CT imaging of the abdomen and pelvis was performed using the standard protocol following bolus administration of intravenous contrast. CONTRAST:  139mL OMNIPAQUE IOHEXOL 300 MG/ML  SOLN COMPARISON:  January 09, 2011 FINDINGS: Lower chest: Bilateral irregular pleural thickening with calcified plaques in the lung bases. Innumerable pleural/subpleural soft tissue nodules along the sub pulmonic peri diaphragmatic bilateral lower lobe pleura. 7 mm left middle lobe pulmonary nodule. Partially visualized 1.4 cm right lower lobe peripheral pulmonary nodule. 9 mm left lower lobe peripheral pulmonary nodule and an additional 7 mm left lower lobe peripheral pulmonary nodule. Marked dilation of the distal esophagus with irregular fungating soft tissue thickening along the anterior wall of the esophagus, partially visualized due to collimation. Sub pathologic by CT criteria paraesophageal lymph nodes measuring up to 6 mm in short axis, likely malignant. Mildly enlarged heart. No pericardial effusion. Gynecomastia.  Hepatobiliary: Several, some confluent soft tissue masses throughout the liver, with the largest in the dome of the liver measuring 7.2 x 6.2 cm. Pre-existing benign cyst in the inferior right lobe of the liver. Pancreas: Fatty replacement of the pancreas. No pancreatic ductal dilation. Spleen: Splenic granulomata. Adrenals/Urinary Tract: Adrenal glands are unremarkable.  Kidneys are without renal calculi, focal lesion, or hydronephrosis. Exophytic right lower pole renal cyst measures 1.5 cm. The base of the urinary bladder is compressed by markedly enlarged prostate gland. Stomach/Bowel: Stomach is within normal limits. Appendix appears normal. No evidence of bowel wall thickening, distention, or inflammatory changes. Vascular/Lymphatic: Aortic atherosclerosis. No enlarged abdominal or pelvic lymph nodes. Nonspecific fatty retroperitoneal lymph nodes in the upper abdomen. Reproductive: Heterogeneous enlarged prostate gland measures 6.4 x 5.9 cm. Other: No abdominal wall hernia or abnormality. No abdominopelvic ascites. Musculoskeletal: No acute or significant osseous findings. Mostly stable lytic lesion within T10 vertebral body. Its stability since 2012 supports benign etiology. IMPRESSION: 1. Marked dilation of the distal esophagus with irregular fungating soft tissue thickening along the anterior wall of the esophagus, partially visualized due to collimation. Sub pathologic by CT criteria paraesophageal lymph nodes measuring up to 6 mm in short axis, likely malignant. 2. Several, some confluent soft tissue masses throughout the liver, with the largest in the dome of the liver measuring 7.2 x 6.2 cm. These findings are highly suspicious for metastatic disease. 3. Bilateral irregular pleural thickening with calcified pleural plaques in the lung bases. Innumerable pleural/subpleural soft tissue nodules along the sub pulmonic peri diaphragmatic bilateral lower lobe pleura. These may represent pleural-based  metastases, or primary tumors. 4. Heterogeneous enlarged prostate gland. The base of the urinary bladder is compressed by markedly enlarged prostate gland. Please correlate to serum PSA values. Aortic Atherosclerosis (ICD10-I70.0). These results were called by telephone at the time of interpretation on 03/21/2019 at 11:38 am to provider [facility], [facility] who verbally acknowledged these results. Electronically Signed   By: Fidela Salisbury M.D.   On: 03/21/2019 11:41   Dg Chest Portable 1 View  Result Date: 03/21/2019 CLINICAL DATA:  Tachypnea and cough EXAM: PORTABLE CHEST 1 VIEW COMPARISON:  01/16/2017 FINDINGS: Chronic bilateral pulmonary opacification from calcified pleural plaques which are extensive. No acute superimposed airspace disease or edema. Borderline heart size. No effusion or pneumothorax. IMPRESSION: No acute finding when accounting for asbestos related pleural disease. Electronically Signed   By: Monte Fantasia M.D.   On: 03/21/2019 08:58    Pending Labs Unresulted Labs (From admission, onward)    Start     Ordered   03/21/19 1226  SARS CORONAVIRUS 2 (TAT 6-24 HRS) Nasopharyngeal Nasopharyngeal Swab  (Asymptomatic/Tier 2 Patients Labs)  Once,   STAT    Question Answer Comment  Is this test for diagnosis or screening Screening   Symptomatic for COVID-19 as defined by CDC No   Hospitalized for COVID-19 No   Admitted to ICU for COVID-19 No   Previously tested for COVID-19 No   Resident in a congregate (group) care setting No   Employed in healthcare setting No      03/21/19 1226   03/21/19 0826  Urine culture  ONCE - STAT,   STAT     03/21/19 0825          Vitals/Pain Today's Vitals   03/21/19 1330 03/21/19 1345 03/21/19 1415 03/21/19 1430  BP: (!) 168/74 (!) 174/92 (!) 183/93 (!) 168/93  Pulse: 78 84 96 87  Resp: 20 (!) 22 (!) 22 (!) 21  Temp:      TempSrc:      SpO2: 97% 98% 96% 97%  Weight:      Height:      PainSc:        Isolation Precautions No active  isolations  Medications Medications  fentaNYL (SUBLIMAZE) injection 50  mcg (50 mcg Intravenous Given 03/21/19 0855)  pantoprazole (PROTONIX) injection 40 mg (40 mg Intravenous Given 03/21/19 1002)  sterile water (preservative free) injection (8 mLs  Given 03/21/19 1002)  iohexol (OMNIPAQUE) 300 MG/ML solution 80 mL (100 mLs Intravenous Contrast Given 03/21/19 1037)  fentaNYL (SUBLIMAZE) injection 50 mcg (50 mcg Intravenous Given 03/21/19 1401)    Mobility walks with device Moderate fall risk       R Recommendations: See Admitting Provider Note  Report given to:   Additional Notes:

## 2019-03-21 NOTE — ED Provider Notes (Signed)
Medical screening examination/treatment/procedure(s) were conducted as a shared visit with non-physician practitioner(s) and myself.  I personally evaluated the patient during the encounter. Briefly, the patient is a 84 y.o. male with history of heart failure, hypertension who presents to the ED with right-sided abdominal pain.  Has had some dark stools.  Was supposed to have an upper GI series today given that he has had the symptoms for the last week or so.  Pain is mostly in the epigastric/right upper quadrant on exam.  Has had need for feeding tube in the past but family does not remember why.  Patient has had some nausea.  Difficulty tolerating p.o.  Was unable to make it to his imaging study today for his GI work-up.  Has tried reflux medications at home with minimal help.  Does have some tenderness in the epigastric and right upper quadrant on exam.  Possible gastritis versus pancreatitis versus bowel obstruction.  Denies any history of ulcers.  Patient appears uncomfortable.  Will give IV Protonix, fluids, pain medicine.  Will evaluate with lab work, CT scan abdomen pelvis.  Will reevaluate.  May need to have discussion with GI.  CT scan shows likely malignant process in the esophagus.  However, appears to have diffuse cancerous process throughout the abdomen, unknown were primary sources.  But likely the reason why patient has had so much difficulty with p.o.  Will touch base with GI.  Will admit for pain control, hydration, oncology plan.  This chart was dictated using voice recognition software.  Despite best efforts to proofread,  errors can occur which can change the documentation meaning.     EKG Interpretation  Date/Time:  Thursday March 21 2019 08:12:34 EDT Ventricular Rate:  97 PR Interval:  174 QRS Duration: 164 QT Interval:  426 QTC Calculation: 541 R Axis:   -15 Text Interpretation:  Normal sinus rhythm Left bundle branch block Abnormal ECG Confirmed by Lennice Sites [phone]) on  03/21/2019 8:15:43 AM           Lennice Sites, DO 03/21/19 1234

## 2019-03-22 ENCOUNTER — Inpatient Hospital Stay (HOSPITAL_COMMUNITY): Payer: Medicare Other | Admitting: Certified Registered Nurse Anesthetist

## 2019-03-22 ENCOUNTER — Encounter (HOSPITAL_COMMUNITY)
Admission: EM | Disposition: A | Discharge status: Hospice | Payer: Self-pay | Source: Home / Self Care | Attending: Student in an Organized Health Care Education/Training Program

## 2019-03-22 ENCOUNTER — Encounter (HOSPITAL_COMMUNITY): Payer: Self-pay | Admitting: Oncology

## 2019-03-22 DIAGNOSIS — J920 Pleural plaque with presence of asbestos: Secondary | ICD-10-CM

## 2019-03-22 DIAGNOSIS — R918 Other nonspecific abnormal finding of lung field: Secondary | ICD-10-CM

## 2019-03-22 DIAGNOSIS — R1310 Dysphagia, unspecified: Secondary | ICD-10-CM

## 2019-03-22 DIAGNOSIS — K228 Other specified diseases of esophagus: Secondary | ICD-10-CM

## 2019-03-22 DIAGNOSIS — R7401 Elevation of levels of liver transaminase levels: Secondary | ICD-10-CM

## 2019-03-22 DIAGNOSIS — Z7189 Other specified counseling: Secondary | ICD-10-CM

## 2019-03-22 DIAGNOSIS — N401 Enlarged prostate with lower urinary tract symptoms: Secondary | ICD-10-CM

## 2019-03-22 DIAGNOSIS — K229 Disease of esophagus, unspecified: Secondary | ICD-10-CM

## 2019-03-22 DIAGNOSIS — N138 Other obstructive and reflux uropathy: Secondary | ICD-10-CM

## 2019-03-22 DIAGNOSIS — Z77090 Contact with and (suspected) exposure to asbestos: Secondary | ICD-10-CM

## 2019-03-22 DIAGNOSIS — R160 Hepatomegaly, not elsewhere classified: Secondary | ICD-10-CM

## 2019-03-22 DIAGNOSIS — R319 Hematuria, unspecified: Secondary | ICD-10-CM

## 2019-03-22 DIAGNOSIS — K219 Gastro-esophageal reflux disease without esophagitis: Secondary | ICD-10-CM

## 2019-03-22 HISTORY — PX: BIOPSY: SHX5522

## 2019-03-22 HISTORY — PX: ESOPHAGOGASTRODUODENOSCOPY (EGD) WITH PROPOFOL: SHX5813

## 2019-03-22 LAB — BASIC METABOLIC PANEL
Anion gap: 14 (ref 5–15)
BUN: 11 mg/dL (ref 8–23)
CO2: 22 mmol/L (ref 22–32)
Calcium: 8.6 mg/dL — ABNORMAL LOW (ref 8.9–10.3)
Chloride: 101 mmol/L (ref 98–111)
Creatinine, Ser: 0.92 mg/dL (ref 0.61–1.24)
GFR calc Af Amer: >60 mL/min (ref >60)
GFR calc non Af Amer: >60 mL/min (ref >60)
Glucose, Bld: 101 mg/dL — ABNORMAL HIGH (ref 70–99)
Potassium: 3.5 mmol/L (ref 3.5–5.1)
Sodium: 137 mmol/L (ref 135–145)

## 2019-03-22 LAB — CBC
HCT: 39.2 % (ref 39.0–52.0)
HCT: 37 % — ABNORMAL LOW (ref 39.0–52.0)
Hemoglobin: 12.9 g/dL — ABNORMAL LOW (ref 13.0–17.0)
Hemoglobin: 12.8 g/dL — ABNORMAL LOW (ref 13.0–17.0)
MCH: 28.2 pg (ref 26.0–34.0)
MCH: 29.4 pg (ref 26.0–34.0)
MCHC: 32.9 g/dL (ref 30.0–36.0)
MCHC: 34.6 g/dL (ref 30.0–36.0)
MCV: 85.6 fL (ref 80.0–100.0)
MCV: 85.1 fL (ref 80.0–100.0)
Platelets: 400 10*3/uL (ref 150–400)
Platelets: 327 10*3/uL (ref 150–400)
RBC: 4.58 MIL/uL (ref 4.22–5.81)
RBC: 4.35 MIL/uL (ref 4.22–5.81)
RDW: 14.9 % (ref 11.5–15.5)
RDW: 14.7 % (ref 11.5–15.5)
WBC: 10 10*3/uL (ref 4.0–10.5)
WBC: 8.7 10*3/uL (ref 4.0–10.5)
nRBC: 0 % (ref 0.0–0.2)
nRBC: 0 % (ref 0.0–0.2)

## 2019-03-22 LAB — HEPATIC FUNCTION PANEL
ALT: 53 U/L — ABNORMAL HIGH (ref 0–44)
AST: 90 U/L — ABNORMAL HIGH (ref 15–41)
Albumin: 2.8 g/dL — ABNORMAL LOW (ref 3.5–5.0)
Alkaline Phosphatase: 215 U/L — ABNORMAL HIGH (ref 38–126)
Bilirubin, Direct: 0.5 mg/dL — ABNORMAL HIGH (ref 0.0–0.2)
Indirect Bilirubin: 1.6 mg/dL — ABNORMAL HIGH (ref 0.3–0.9)
Total Bilirubin: 2.1 mg/dL — ABNORMAL HIGH (ref 0.3–1.2)
Total Protein: 6.3 g/dL — ABNORMAL LOW (ref 6.5–8.1)

## 2019-03-22 LAB — MAGNESIUM: Magnesium: 1.8 mg/dL (ref 1.7–2.4)

## 2019-03-22 LAB — SURGICAL PCR SCREEN
MRSA, PCR: NEGATIVE
Staphylococcus aureus: NEGATIVE

## 2019-03-22 SURGERY — ESOPHAGOGASTRODUODENOSCOPY (EGD) WITH PROPOFOL
Anesthesia: Monitor Anesthesia Care

## 2019-03-22 MED ORDER — LACTATED RINGERS IV SOLN
INTRAVENOUS | Status: DC
  Administered 2019-03-22: 14:00:00 via INTRAVENOUS

## 2019-03-22 MED ORDER — FAMOTIDINE 20 MG PO TABS
20.0000 mg | ORAL_TABLET | Freq: Two times a day (BID) | ORAL | Status: DC
  Administered 2019-03-22 (×2): 20 mg via ORAL
  Filled 2019-03-22 (×2): qty 1

## 2019-03-22 MED ORDER — PANTOPRAZOLE SODIUM 40 MG PO TBEC
40.0000 mg | DELAYED_RELEASE_TABLET | Freq: Two times a day (BID) | ORAL | Status: DC
  Administered 2019-03-22: 40 mg via ORAL
  Filled 2019-03-22: qty 1

## 2019-03-22 MED ORDER — PROPOFOL 10 MG/ML IV BOLUS
INTRAVENOUS | Status: DC | PRN
  Administered 2019-03-22 (×2): 20 mg via INTRAVENOUS

## 2019-03-22 MED ORDER — PANTOPRAZOLE SODIUM 40 MG PO TBEC: 40.0000 mg | DELAYED_RELEASE_TABLET | Freq: Every day | ORAL | Status: DC

## 2019-03-22 MED ORDER — PROPOFOL 500 MG/50ML IV EMUL
INTRAVENOUS | Status: DC | PRN
  Administered 2019-03-22: 75 ug/kg/min via INTRAVENOUS

## 2019-03-22 SURGICAL SUPPLY — 15 items

## 2019-03-22 NOTE — Op Note (Signed)
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   Findings: A large fungating, ulcerated, bleeding, friable mass noted from 26 cm from insertion to 40 cm.Biopsies taken. The mass was 2/3 rd circumferential and compatible with a malignant mass. The scope could be advanced into the gastric cavity without resistance. There was blood noted in the gastric cavity, but underlying mucosa appeared unremarkable. Cardia and fundus along with duodenal bulb and duodenum were normal.   Recommendations: Clear liquid diet, PPI BID. Await pathology, oncology consult for decision about goal of care as patient has evidence of metastasis. Patient will likely need esophageal stent vs.feeding tube placement.   Ronnette Juniper, MD

## 2019-03-22 NOTE — Transfer of Care (Signed)
Immediate Anesthesia Transfer of Care Note  Patient: Eric Evans  Procedure(s) Performed: ESOPHAGOGASTRODUODENOSCOPY (EGD) WITH PROPOFOL (N/A ) BIOPSY  Patient Location: Endoscopy Unit  Anesthesia Type:MAC  Level of Consciousness: drowsy  Airway & Oxygen Therapy: Patient Spontanous Breathing and Patient connected to nasal cannula oxygen  Post-op Assessment: Report given to RN and Post -op Vital signs reviewed and stable  Post vital signs: Reviewed and stable  Last Vitals:  Vitals Value Taken Time  BP    Temp    Pulse    Resp    SpO2      Last Pain:  Vitals:   03/22/19 1400  TempSrc: Oral  PainSc: 0-No pain      Patients Stated Pain Goal: 1 (44/92/01 0071)  Complications: No apparent anesthesia complications

## 2019-03-22 NOTE — Progress Notes (Signed)
Subjective:   Eric Evans states he is doing okay but is concerned that he coughed up blood while trying to have a bowel movement. He states it was not a large amount but it has never happened before.   In addition, he was not able to urinate at all. This is a problem he has had for a couple years at least, and at one point, did require a foley catheter. He has never had a work up for prostate issues though.   Objective:  Vital signs in last 24 hours: Vitals:   03/21/19 1430 03/21/19 1445 03/21/19 1514 03/22/19 0456  BP: (!) 168/93 (!) 159/76 (!) 174/77 (!) 151/79  Pulse: 87  89 85  Resp: (!) 21   20  Temp:   97.7 F (36.5 C) 98 F (36.7 C)  TempSrc:   Oral Oral  SpO2: 97%  98% 96%  Weight:      Height:        Physical Exam Vitals signs and nursing note reviewed.  Constitutional:      Appearance: He is ill-appearing.     Interventions: Cervical collar in place.  Cardiovascular:     Rate and Rhythm: Normal rate and regular rhythm.     Heart sounds: No murmur.  Pulmonary:     Effort: Pulmonary effort is normal. No respiratory distress.     Breath sounds: Rales (bibasilar) present. No wheezing.  Abdominal:     Tenderness: There is abdominal tenderness in the right upper quadrant and right lower quadrant.  Skin:    General: Skin is warm and dry.     Coloration: Skin is pale.  Neurological:     Mental Status: He is alert.   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 His CT is exceedingly concerning for malignancy which is supported by his history. As far as the primary source this could be esophageal from uncontrolled GERD vs 2/2 to his asbestos exposure.   # Esophageal Tumor: # Liver Metastasis:  # Multiple Pleural/Subpleural nodules:   Extremely concerning for malignancy given widespread involvement, large size and irregular shape. With the CT results, it seems the esophageal mass may be the primary with metastasis to the liver. GI was consulted yesterday with EGD with biopsy planned for today. The results were indicative of a extremely large (14cm long) fungating ulcerated mass, 2/3 circumferential but no completely occluding as they were able to scope the duodenum and gastric body. Palliative and oncology were both consulted and on board. Oncology feels that malignancy is likely esophageal as primary with metastasis to the lung, pleura and liver. Prognosis is poor, especially given his overall performance. Palliative will have a family meeting tomorrow with patient, wife and two sons to discuss goals of care and next steps.   - GI, Palliative and oncology are on board and we appreciate their recommendations  - Clear liquids  - CBC and CMP - IVF @ 75 cc/hr  - Morphine 2-4 q4h PRN  # Prostate Enlargement:  # Hematuria CT notable for heterogeneous prostate measuring 6.4 x 5.9 cm. Appears to be compressing the lower part of the bladder. Patient is having urinary symptoms consistent with bladder compression and has required in/out caths.  If requires more often per day, consider foley catheter.   - Monitor for urinary obstruction - Bladder scan and in/out PRN  # Transaminitis # Elevated Bilirubin Likely secondary to hepatic metastasis. Mildly elevated AST and ALT in a 2:1 ratio. No change on repeat labs the next day. INR and PT are unremarkable, so no liver failure. Bilirubin is stable as well. Labs indicate a indirect bilirubin predominance.   - CMP to trend LFTs    Dispo: Anticipated discharge pending medical improvement.   Dr. Jose Persia Internal Medicine PGY-1  Pager: [phone] 03/22/2019, 7:03 AM

## 2019-03-22 NOTE — Op Note (Signed)
[facility] Patient Name: Eric Evans Procedure Date : 03/22/2019 MRN: [id number] Attending MD: Ronnette Juniper , MD Date of Birth: 03-Feb-1935 CSN: [id number] Age: 84 Admit Type: Inpatient Procedure:                Upper GI endoscopy Indications:              Dysphagia, Abnormal CT of the GI tract- esophageal                            mass with metastasis to liver and lungs Providers:                Ronnette Juniper, MD, Glori Bickers, RN, Lina Sar,                            Technician, Lazaro Arms, Technician Referring MD:              Medicines:                Monitored Anesthesia Care Complications:            No immediate complications. Estimated blood loss:                            Minimal. Estimated Blood Loss:     Estimated blood loss was minimal. Procedure:                Pre-Anesthesia Assessment:                           - Prior to the procedure, a History and Physical                            was performed, and patient medications and                            allergies were reviewed. The patient's tolerance of                            previous anesthesia was also reviewed. The risks                            and benefits of the procedure and the sedation                            options and risks were discussed with the patient.                            All questions were answered, and informed consent                            was obtained. Prior Anticoagulants: The patient has                            taken no previous anticoagulant or antiplatelet  agents. ASA Grade Assessment: III - A patient with                            severe systemic disease. After reviewing the risks                            and benefits, the patient was deemed in                            satisfactory condition to undergo the procedure.                           After obtaining informed consent, the endoscope was      passed under direct vision. Throughout the                            procedure, the patient's blood pressure, pulse, and                            oxygen saturations were monitored continuously. The                            [id number]-[id number] Olympus gastroscope was                            introduced through the mouth, and advanced to the                            second part of duodenum. The upper GI endoscopy was                            accomplished without difficulty. The patient                            tolerated the procedure well. Scope In: Scope Out: Findings:      A large, fungating and ulcerating mass with bleeding and stigmata of       recent bleeding was found in the middle third of the esophagus and in       the lower third of the esophagus, 26 to 40 cm from the incisors. The       mass was partially obstructing and partially circumferential (involving       two thirds of the lumen circumference). Biopsies were taken with a cold       forceps for histology.      Red blood was found in the gastric body. Otherwise normal gastric cavity.      The cardia and gastric fundus were normal on retroflexion.      The examined duodenum was normal. Impression:               - Partially obstructing, malignant esophageal tumor                            was found in the middle third of the esophagus and  in the lower third of the esophagus. Biopsied.                           - Red blood in the gastric body.                           - Normal examined duodenum. Moderate Sedation:      Patient did not receive moderate sedation for this procedure, but       instead received monitored anesthesia care. Recommendation:           - Await pathology results.                           - Clear liquid diet.                           - Consider esophageal stent vs. feeding tube                            placement.                           - Oncology  consult to decide goal of care. Procedure Code(s):        --- Professional ---                           [id number], Esophagogastroduodenoscopy, flexible,                            transoral; with biopsy, single or multiple Diagnosis Code(s):        --- Professional ---                           C15.4, Malignant neoplasm of middle third of                            esophagus                           C15.5, Malignant neoplasm of lower third of                            esophagus                           K92.2, Gastrointestinal hemorrhage, unspecified                           R13.10, Dysphagia, unspecified                           R93.3, Abnormal findings on diagnostic imaging of                            other parts of digestive tract CPT copyright 2019 [employer]. All rights reserved. The codes documented in this report are preliminary and upon coder review may  be revised to meet current  compliance requirements. Ronnette Juniper, MD 03/22/2019 2:36:32 PM This report has been signed electronically. Number of Addenda: 0

## 2019-03-22 NOTE — Progress Notes (Signed)
Patient assisted to the bathroom, While in the bathroom patient started  coughing and he was spitting out bright red blood. He c/o pain to his RUQ, Med with prn pain med. Wife at bedside and states that patient has never coughed up blood.MD was notified.

## 2019-03-22 NOTE — Progress Notes (Signed)
Bladder scan yield urine >320cc, I&O performed with 500cc of blood tinged urine collected. Patient verbalized relief. Will cont to monitor.

## 2019-03-22 NOTE — Progress Notes (Signed)
  Date: 03/22/2019  Patient name: Eric Evans  Medical record number: [id number]  Date of birth: 1934/06/22   I have seen and evaluated Eric Evans and discussed their care with the Residency Team. 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  He presented with dysphagia and abdominal pain.  Imaging showed dilation of the distal esophagus with an irregular fungating soft tissue thickening, liver masses, enlarged prostate gland compressing the bladder, and innumerable pleural /subpleural soft tissue nodules bilateral lobe pleura.  This was followed by an EGD which showed a large fungating, ulcerated, bleeding, friable mass involving 14 cm of the distal esophagus it was felt to be consistent with a malignant mass.  Palliative care has been consulted and will have a family conference tomorrow on the 17th.  Oncology has also been consulted and their recommendations are in the chart.  This morning, Eric Evans denies pain but endorses inability to pass urine.  He also has noticed hemoptysis.  Vitals:   03/22/19 1454 03/22/19 1527  BP: (!) 161/114 (!) 188/76  Pulse:  78  Resp:  14  Temp:  97.7 F (36.5 C)  SpO2:  94%  General cachectic male with muscular wasting but no acute distress HRRR GI + BS  Labs and imaging reviewed  Assessment and Plan: I have seen and evaluated the patient as outlined above. 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  He now presents with dysphagia, and esophageal mass, liver masses, pulmonary masses, and enlarged prostate.  We are awaiting biopsy results from the esophageal mass to confirm malignancy.  He is unable to eat and palliative care will discuss interventions suggested by GI including stenting and PEG in the  context of goals of care and no discussion tomorrow.  He already has muscular wasting and a low albumin and is unlikely able to tolerate many interventions.  1.  Presumed malignancy with metastasis - await biopsy results and [place] conversation.  2.  Inability to urinate secondary to bladder compression by prostate - bladder scan and catheter, Foley or I&O, as needed  Bartholomew Crews, MD 10/16/20203:38 PM

## 2019-03-22 NOTE — Anesthesia Postprocedure Evaluation (Signed)
Anesthesia Post Note  Patient: Eric Evans  Procedure(s) Performed: ESOPHAGOGASTRODUODENOSCOPY (EGD) WITH PROPOFOL (N/A ) BIOPSY     Patient location during evaluation: Endoscopy Anesthesia Type: MAC Level of consciousness: awake and alert Pain management: pain level controlled Vital Signs Assessment: post-procedure vital signs reviewed and stable Respiratory status: spontaneous breathing, nonlabored ventilation and respiratory function stable Cardiovascular status: blood pressure returned to baseline and stable Postop Assessment: no apparent nausea or vomiting Anesthetic complications: no    Last Vitals:  Vitals:   03/22/19 1454 03/22/19 1527  BP: (!) 161/114 (!) 188/76  Pulse:  78  Resp:  14  Temp:  36.5 C  SpO2:  94%    Last Pain:  Vitals:   03/22/19 1527  TempSrc: Oral  PainSc:                  Lidia Collum

## 2019-03-22 NOTE — Brief Op Note (Signed)
03/21/2019 - 03/22/2019  2:39 PM  PATIENT:  Celedonio Miyamoto  84 y.o. male  PRE-OPERATIVE DIAGNOSIS:  Dysphagia, esopahgeal mass  POST-OPERATIVE DIAGNOSIS:  biopsy of esophageal mass, distal esophegeal mass  PROCEDURE:  Procedure(s): ESOPHAGOGASTRODUODENOSCOPY (EGD) WITH PROPOFOL (N/A) BIOPSY  SURGEON:  Surgeon(s) and Role:    Ronnette Juniper, MD - Primary  PHYSICIAN ASSISTANT:   ASSISTANTS: Julaine Fusi, Lina Sar, Tech, Lazaro Arms, Tech  ANESTHESIA:   MAC  EBL:  5 ml  BLOOD ADMINISTERED:none  DRAINS: none   LOCAL MEDICATIONS USED:  NONE  SPECIMEN:  Biopsy / Limited Resection  DISPOSITION OF SPECIMEN:  PATHOLOGY  COUNTS:  YES  TOURNIQUET:  * No tourniquets in log *  DICTATION: .Dragon Dictation  PLAN OF CARE: Admit to inpatient   PATIENT DISPOSITION:  PACU - hemodynamically stable.   Delay start of Pharmacological VTE agent (>24hrs) due to surgical blood loss or risk of bleeding: yes

## 2019-03-22 NOTE — Anesthesia Procedure Notes (Signed)
Procedure Name: MAC Date/Time: 03/22/2019 2:15 PM Performed by: Colin Benton, CRNA Pre-anesthesia Checklist: Patient identified, Emergency Drugs available, Suction available and Patient being monitored Patient Re-evaluated:Patient Re-evaluated prior to induction Oxygen Delivery Method: Nasal cannula Induction Type: IV induction Dental Injury: Teeth and Oropharynx as per pre-operative assessment

## 2019-03-22 NOTE — Consult Note (Signed)
[place]  [phone] [phone]   [place]  Referral MD: Dr. Larey Dresser  Reason for Referral: Esophageal mass, liver mass. Lung nodules  HPI: Mr. Eric Evans is an 84 year old male with a past medical history significant for HTN, CHF, and occupational asbestos exposure. Has not had routine primary care in the past 5-10 years. The patient's wife assists with providing the history. The patient presented to the emergency room with acute onset of right-sided abdominal pain which started the day of admission and ongoing dysphagia for the past 3-4 weeks which has been progressive.  The patient was initially had difficulty swallowing solids but was able to drink liquids such as Gatorade very easily.  However, for the past week he has not been able to take in any liquids.  He was initially seen in the emergency room on 03/15/2019 for the symptoms and was referred to gastroenterology as an outpatient, but due to worsening symptoms, he presented to the emergency room again on 03/21/2019.  On admission, his hemoglobin was normal at 14.8, AST was elevated at 89, ALT elevated at 49, alkaline phosphatase 217, total bilirubin 2.3.  Stool for occult blood was positive.  He had a CT of the abdomen pelvis with contrast which showed marked dilation of the distal esophagus with irregular fungating soft tissue thickening along the anterior wall of the esophagus, cell pathologic by CT criteria paraesophageal lymph nodes measuring up to 6 mm which are likely malignant, several soft tissue masses throughout the liver with the largest in the dome of the liver measuring 7.2 x 6.2 cm which are highly suspicious for metastatic disease, bilateral irregular pleural thickening with calcified pleural plaques at the lung bases, innumerable pleural/subpleural soft tissue nodules along the subpulmonic peridiaphragmatic bilateral lower lobe pleura which could represent  pleural-based metastases versus primary tumors, heterogeneous enlarged prostate gland with the base of the urinary bladder compressed by markedly enlarged prostate gland.  The patient has been seen by gastroenterology who plans for EGD later today.  Of note, the patient has had problems with dysphagia in the past and required a feeding tube placement 2011.  His nutritional status improved and the feeding tube was removed.  Denies prior history of upper endoscopy or colonoscopy.  Today, the patient reports that his abdominal pain has resolved.  The patient had an episode of hematemesis earlier today.  His wife had not noticed any hematemesis prior to admission.  He also required I&O catheterization earlier today with 500 cc of blood-tinged urine removed.  His wife has not noticed any hematuria at home.  The patient reports anorexia and weight loss but is unsure how much weight he has lost.  Denies headaches and dizziness.  Denies fevers and chills.  Denies chest pain, shortness of breath, nausea, vomiting, constipation, diarrhea.  The patient had melena at home.  Denies lower extremity edema.  The patient denies neck pain but wears a neck brace since prior neck surgery. The patient is married and lives in [place], [place].  The patient has not driven in at least 8 years.  His wife has to assist him with ADLs such as bathing, dressing, and personal care.  He has 2 adult children who live in [place].  Denies history of alcohol use.  Has a history of smoking approximately half a pack of cigarettes per day for about 15 years.  He quit 30 to 40 years ago.  Reports family history of a  maternal uncle with cancer, 3 maternal cousins with cancer, and a half brother with liver cancer.  Medical oncology was asked see the patient to make recommendations regarding his esophageal mass, liver mass, and pulmonary lesions.   Past Medical History:  Diagnosis Date  . CHF (congestive heart failure) ([place])   .  Hypertension   :  Past Surgical History:  Procedure Laterality Date  . CERVICAL SPINE SURGERY    :  Current Facility-Administered Medications  Medication Dose Route Frequency Provider Last Rate Last Dose  . 0.9 %  sodium chloride infusion   Intravenous Continuous Kathi Ludwig, MD 75 mL/hr at 03/22/19 0809    . 0.9 %  sodium chloride infusion   Intravenous Continuous Ronnette Juniper, MD      . acetaminophen (TYLENOL) tablet 650 mg  650 mg Oral Q6H PRN Kathi Ludwig, MD       Or  . acetaminophen (TYLENOL) suppository 650 mg  650 mg Rectal Q6H PRN Kathi Ludwig, MD      . enoxaparin (LOVENOX) injection 40 mg  40 mg Subcutaneous Q24H Kathi Ludwig, MD   40 mg at 03/21/19 1956  . morphine 2 MG/ML injection 2-4 mg  2-4 mg Intravenous Q4H PRN Kathi Ludwig, MD   4 mg at 03/22/19 0801  . mupirocin ointment (BACTROBAN) 2 % 1 application  1 application Nasal BID Bartholomew Crews, MD         No Known Allergies:  Family History  Problem Relation Age of Onset  . Cancer Maternal Uncle   . Liver cancer Half-Brother   :  Social History   Socioeconomic History  . Marital status: Married    Spouse name: Not on file  . Number of children: 2  . Years of education: Not on file  . Highest education level: Not on file  Occupational History  . Not on file  Social Needs  . Financial resource strain: Not on file  . Food insecurity    Worry: Not on file    Inability: Not on file  . Transportation needs    Medical: Not on file    Non-medical: Not on file  Tobacco Use  . Smoking status: Former Smoker    Packs/day: 0.50    Years: 15.00    Pack years: 7.50    Quit date: 06/07/1983    Years since quitting: 35.8  . Smokeless tobacco: Never Used  Substance and Sexual Activity  . Alcohol use: No  . Drug use: No  . Sexual activity: Not on file  Lifestyle  . Physical activity    Days per week: Not on file    Minutes per session: Not on file  . Stress: Not on  file  Relationships  . Social Herbalist on phone: Not on file    Gets together: Not on file    Attends religious service: Not on file    Active member of club or organization: Not on file    Attends meetings of clubs or organizations: Not on file    Relationship status: Not on file  . Intimate partner violence    Fear of current or ex partner: Not on file    Emotionally abused: Not on file    Physically abused: Not on file    Forced sexual activity: Not on file  Other Topics Concern  . Not on file  Social History Narrative   The patient lives with his wife in [place], [place].  Has not driven since [id number].   Wife has to help with ADLs and has done so for about 15 years.  :  Review of Systems: A comprehensive 14 point review of systems was negative except as noted in the HPI.  Exam: Patient Vitals for the past 24 hrs:  BP Temp Temp src Pulse Resp SpO2  03/22/19 0752 (!) 161/94 98 F (36.7 C) Oral 96 - 95 %  03/22/19 0456 (!) 151/79 98 F (36.7 C) Oral 85 20 96 %  03/21/19 2005 (!) 169/69 98.5 F (36.9 C) Oral 81 18 95 %  03/21/19 1514 (!) 174/77 97.7 F (36.5 C) Oral 89 - 98 %  03/21/19 1445 (!) 159/76 - - - - -  03/21/19 1430 (!) 168/93 - - 87 (!) 21 97 %  03/21/19 1415 (!) 183/93 - - 96 (!) 22 96 %  03/21/19 1345 (!) 174/92 - - 84 (!) 22 98 %  03/21/19 1330 (!) 168/74 - - 78 20 97 %    General: Chronically ill-appearing male, cachectic, no distress.   Eyes:  no scleral icterus.   ENT: Oral mucosa dry, there were no oropharyngeal lesions.   Neck was without thyromegaly.   Lymphatics:  Negative supraclavicular or axillary adenopathy.   Respiratory: lungs were clear bilaterally without wheezing or crackles.   Cardiovascular:  Regular rate and rhythm, S1/S2, without murmur. There was no pedal edema.   GI: Positive bowel sounds, soft, no pain with palpation Musculoskeletal: Neck brace in place, no spinal tenderness of palpation of vertebral spine.   Skin exam  was without echymosis, petichae.   Neuro exam was nonfocal. Patient was alert and oriented.  Attention was good.   Language was appropriate.  Mood was normal without depression.  Speech was not pressured.  Thought content was not tangential.     Lab Results  Component Value Date   WBC 10.0 03/22/2019   HGB 12.9 (L) 03/22/2019   HCT 39.2 03/22/2019   PLT 400 03/22/2019   GLUCOSE 101 (H) 03/22/2019   ALT 53 (H) 03/22/2019   AST 90 (H) 03/22/2019   NA 137 03/22/2019   K 3.5 03/22/2019   CL 101 03/22/2019   CREATININE 0.92 03/22/2019   BUN 11 03/22/2019   CO2 22 03/22/2019    Ct Abdomen Pelvis W Contrast  Result Date: 03/21/2019 CLINICAL DATA:  Right-sided abdominal pain. Area of palpable concern along the right chest wall. EXAM: CT ABDOMEN AND PELVIS WITH CONTRAST TECHNIQUE: Multidetector CT imaging of the abdomen and pelvis was performed using the standard protocol following bolus administration of intravenous contrast. CONTRAST:  168mL OMNIPAQUE IOHEXOL 300 MG/ML  SOLN COMPARISON:  January 09, 2011 FINDINGS: Lower chest: Bilateral irregular pleural thickening with calcified plaques in the lung bases. Innumerable pleural/subpleural soft tissue nodules along the sub pulmonic peri diaphragmatic bilateral lower lobe pleura. 7 mm left middle lobe pulmonary nodule. Partially visualized 1.4 cm right lower lobe peripheral pulmonary nodule. 9 mm left lower lobe peripheral pulmonary nodule and an additional 7 mm left lower lobe peripheral pulmonary nodule. Marked dilation of the distal esophagus with irregular fungating soft tissue thickening along the anterior wall of the esophagus, partially visualized due to collimation. Sub pathologic by CT criteria paraesophageal lymph nodes measuring up to 6 mm in short axis, likely malignant. Mildly enlarged heart. No pericardial effusion. Gynecomastia. Hepatobiliary: Several, some confluent soft tissue masses throughout the liver, with the largest in the dome of  the liver measuring 7.2 x 6.2 cm. Pre-existing  benign cyst in the inferior right lobe of the liver. Pancreas: Fatty replacement of the pancreas. No pancreatic ductal dilation. Spleen: Splenic granulomata. Adrenals/Urinary Tract: Adrenal glands are unremarkable. Kidneys are without renal calculi, focal lesion, or hydronephrosis. Exophytic right lower pole renal cyst measures 1.5 cm. The base of the urinary bladder is compressed by markedly enlarged prostate gland. Stomach/Bowel: Stomach is within normal limits. Appendix appears normal. No evidence of bowel wall thickening, distention, or inflammatory changes. Vascular/Lymphatic: Aortic atherosclerosis. No enlarged abdominal or pelvic lymph nodes. Nonspecific fatty retroperitoneal lymph nodes in the upper abdomen. Reproductive: Heterogeneous enlarged prostate gland measures 6.4 x 5.9 cm. Other: No abdominal wall hernia or abnormality. No abdominopelvic ascites. Musculoskeletal: No acute or significant osseous findings. Mostly stable lytic lesion within T10 vertebral body. Its stability since 2012 supports benign etiology. IMPRESSION: 1. Marked dilation of the distal esophagus with irregular fungating soft tissue thickening along the anterior wall of the esophagus, partially visualized due to collimation. Sub pathologic by CT criteria paraesophageal lymph nodes measuring up to 6 mm in short axis, likely malignant. 2. Several, some confluent soft tissue masses throughout the liver, with the largest in the dome of the liver measuring 7.2 x 6.2 cm. These findings are highly suspicious for metastatic disease. 3. Bilateral irregular pleural thickening with calcified pleural plaques in the lung bases. Innumerable pleural/subpleural soft tissue nodules along the sub pulmonic peri diaphragmatic bilateral lower lobe pleura. These may represent pleural-based metastases, or primary tumors. 4. Heterogeneous enlarged prostate gland. The base of the urinary bladder is compressed by  markedly enlarged prostate gland. Please correlate to serum PSA values. Aortic Atherosclerosis (ICD10-I70.0). These results were called by telephone at the time of interpretation on 03/21/2019 at 11:38 am to provider [facility] who verbally acknowledged these results. Electronically Signed   By: Fidela Salisbury M.D.   On: 03/21/2019 11:41   Dg Chest Portable 1 View  Result Date: 03/21/2019 CLINICAL DATA:  Tachypnea and cough EXAM: PORTABLE CHEST 1 VIEW COMPARISON:  01/16/2017 FINDINGS: Chronic bilateral pulmonary opacification from calcified pleural plaques which are extensive. No acute superimposed airspace disease or edema. Borderline heart size. No effusion or pneumothorax. IMPRESSION: No acute finding when accounting for asbestos related pleural disease. Electronically Signed   By: Monte Fantasia M.D.   On: 03/21/2019 08:58     Ct Abdomen Pelvis W Contrast  Result Date: 03/21/2019 CLINICAL DATA:  Right-sided abdominal pain. Area of palpable concern along the right chest wall. EXAM: CT ABDOMEN AND PELVIS WITH CONTRAST TECHNIQUE: Multidetector CT imaging of the abdomen and pelvis was performed using the standard protocol following bolus administration of intravenous contrast. CONTRAST:  194mL OMNIPAQUE IOHEXOL 300 MG/ML  SOLN COMPARISON:  January 09, 2011 FINDINGS: Lower chest: Bilateral irregular pleural thickening with calcified plaques in the lung bases. Innumerable pleural/subpleural soft tissue nodules along the sub pulmonic peri diaphragmatic bilateral lower lobe pleura. 7 mm left middle lobe pulmonary nodule. Partially visualized 1.4 cm right lower lobe peripheral pulmonary nodule. 9 mm left lower lobe peripheral pulmonary nodule and an additional 7 mm left lower lobe peripheral pulmonary nodule. Marked dilation of the distal esophagus with irregular fungating soft tissue thickening along the anterior wall of the esophagus, partially visualized due to collimation. Sub pathologic by CT  criteria paraesophageal lymph nodes measuring up to 6 mm in short axis, likely malignant. Mildly enlarged heart. No pericardial effusion. Gynecomastia. Hepatobiliary: Several, some confluent soft tissue masses throughout the liver, with the largest in the dome of the liver  measuring 7.2 x 6.2 cm. Pre-existing benign cyst in the inferior right lobe of the liver. Pancreas: Fatty replacement of the pancreas. No pancreatic ductal dilation. Spleen: Splenic granulomata. Adrenals/Urinary Tract: Adrenal glands are unremarkable. Kidneys are without renal calculi, focal lesion, or hydronephrosis. Exophytic right lower pole renal cyst measures 1.5 cm. The base of the urinary bladder is compressed by markedly enlarged prostate gland. Stomach/Bowel: Stomach is within normal limits. Appendix appears normal. No evidence of bowel wall thickening, distention, or inflammatory changes. Vascular/Lymphatic: Aortic atherosclerosis. No enlarged abdominal or pelvic lymph nodes. Nonspecific fatty retroperitoneal lymph nodes in the upper abdomen. Reproductive: Heterogeneous enlarged prostate gland measures 6.4 x 5.9 cm. Other: No abdominal wall hernia or abnormality. No abdominopelvic ascites. Musculoskeletal: No acute or significant osseous findings. Mostly stable lytic lesion within T10 vertebral body. Its stability since 2012 supports benign etiology. IMPRESSION: 1. Marked dilation of the distal esophagus with irregular fungating soft tissue thickening along the anterior wall of the esophagus, partially visualized due to collimation. Sub pathologic by CT criteria paraesophageal lymph nodes measuring up to 6 mm in short axis, likely malignant. 2. Several, some confluent soft tissue masses throughout the liver, with the largest in the dome of the liver measuring 7.2 x 6.2 cm. These findings are highly suspicious for metastatic disease. 3. Bilateral irregular pleural thickening with calcified pleural plaques in the lung bases. Innumerable  pleural/subpleural soft tissue nodules along the sub pulmonic peri diaphragmatic bilateral lower lobe pleura. These may represent pleural-based metastases, or primary tumors. 4. Heterogeneous enlarged prostate gland. The base of the urinary bladder is compressed by markedly enlarged prostate gland. Please correlate to serum PSA values. Aortic Atherosclerosis (ICD10-I70.0). These results were called by telephone at the time of interpretation on 03/21/2019 at 11:38 am to provider [facility] who verbally acknowledged these results. Electronically Signed   By: Fidela Salisbury M.D.   On: 03/21/2019 11:41   Dg Chest Portable 1 View  Result Date: 03/21/2019 CLINICAL DATA:  Tachypnea and cough EXAM: PORTABLE CHEST 1 VIEW COMPARISON:  01/16/2017 FINDINGS: Chronic bilateral pulmonary opacification from calcified pleural plaques which are extensive. No acute superimposed airspace disease or edema. Borderline heart size. No effusion or pneumothorax. IMPRESSION: No acute finding when accounting for asbestos related pleural disease. Electronically Signed   By: Monte Fantasia M.D.   On: 03/21/2019 08:58   Assessment and Plan:  1.  Esophageal mass with liver masses, and pulmonary lesions -CT of the abdomen pelvis results were discussed with the patient and his wife -We discussed that the findings are concerning for esophageal cancer with likely spread to the liver and lungs -We discussed that we will await biopsy which will be performed later today to confirm diagnosis -Further recommendations pending biopsy results -The patient has a poor performance status and I expressed concern to the wife regarding his ability to tolerate a systemic treatment, but will readdress this once diagnosis is confirmed.  2.  GI bleed -The patient has had melena and hematemesis which is likely related to his esophageal mass -EGD later today  3.  Dysphagia -Secondary to esophageal mass -The patient has had significant  weight loss due to lack of p.o. intake -The patient may benefit from feeding tube placement if assistant with goals of care  4.  Transaminitis and elevated bilirubin -Secondary to liver masses -Avoid hepatotoxic drugs -Monitor liver function closely  5.  Goals of care -I discussed with the patient and his wife the concerning findings on the CT scan which  are highly suspicious for metastatic esophageal cancer -The patient has a poor performance status and I expressed my concern that he may not tolerate systemic treatment very well -Agree with palliative care consult for goals of care discussion -The patient's wife has a copy of a healthcare power of attorney and living well which I have asked nursing to make a copy of and placed on his chart -The patient is DNR/DNI  Thank you for this referral.   Mikey Bussing, DNP, AGPCNP-BC, AOCNP

## 2019-03-22 NOTE — Interval H&P Note (Signed)
History and Physical Interval Note: 84/male with esophageal mass for an EGD with biopsies. 03/22/2019 1:53 PM  Eric Evans  has presented today for EGD, with the diagnosis of Dysphagia, esopahgeal mass.  The various methods of treatment have been discussed with the patient and family. After consideration of risks, benefits and other options for treatment, the patient has consented to  Procedure(s): ESOPHAGOGASTRODUODENOSCOPY (EGD) WITH PROPOFOL (N/A) as a surgical intervention.  The patient's history has been reviewed, patient examined, no change in status, stable for surgery.  I have reviewed the patient's chart and labs.  Questions were answered to the patient's satisfaction.     Ronnette Juniper

## 2019-03-22 NOTE — Plan of Care (Signed)
  Problem: Pain Managment: Goal: General experience of comfort will improve Outcome: Progressing   

## 2019-03-22 NOTE — Anesthesia Preprocedure Evaluation (Addendum)
Anesthesia Evaluation  Patient identified by MRN, date of birth, ID band Patient awake    Reviewed: Allergy & Precautions, H&P , NPO status , Patient's Chart, lab work & pertinent test results  Airway Mallampati: III  TM Distance: >3 FB Neck ROM: Full    Dental no notable dental hx. (+) Edentulous Upper, Edentulous Lower, Dental Advisory Given   Pulmonary neg pulmonary ROS, former smoker,    Pulmonary exam normal breath sounds clear to auscultation       Cardiovascular hypertension, + dysrhythmias  Rhythm:Regular Rate:Normal     Neuro/Psych negative neurological ROS  negative psych ROS   GI/Hepatic negative GI ROS, Neg liver ROS,   Endo/Other  negative endocrine ROS  Renal/GU negative Renal ROS  negative genitourinary   Musculoskeletal   Abdominal   Peds  Hematology negative hematology ROS (+)   Anesthesia Other Findings   Reproductive/Obstetrics negative OB ROS                            Anesthesia Physical Anesthesia Plan  ASA: II  Anesthesia Plan: MAC   Post-op Pain Management:    Induction: Intravenous  PONV Risk Score and Plan: 1 and Propofol infusion  Airway Management Planned: Nasal Cannula  Additional Equipment:   Intra-op Plan:   Post-operative Plan:   Informed Consent: I have reviewed the patients History and Physical, chart, labs and discussed the procedure including the risks, benefits and alternatives for the proposed anesthesia with the patient or authorized representative who has indicated his/her understanding and acceptance.     Dental advisory given  Plan Discussed with: CRNA  Anesthesia Plan Comments:         Anesthesia Quick Evaluation

## 2019-03-23 DIAGNOSIS — Z515 Encounter for palliative care: Secondary | ICD-10-CM

## 2019-03-23 DIAGNOSIS — C787 Secondary malignant neoplasm of liver and intrahepatic bile duct: Secondary | ICD-10-CM

## 2019-03-23 DIAGNOSIS — Z7189 Other specified counseling: Secondary | ICD-10-CM

## 2019-03-23 LAB — URINE CULTURE: Culture: 70000 — AB

## 2019-03-23 MED ORDER — SENNA 8.6 MG PO TABS
1.0000 | ORAL_TABLET | Freq: Every day | ORAL | Status: DC
  Filled 2019-03-23: qty 1

## 2019-03-23 MED ORDER — OXYCODONE HCL 5 MG/5ML PO SOLN: 5.0000 mg | ORAL | Status: DC | PRN

## 2019-03-23 MED ORDER — BIOTENE DRY MOUTH MT LIQD: 15.0000 mL | OROMUCOSAL | Status: DC | PRN

## 2019-03-23 MED ORDER — HALOPERIDOL 0.5 MG PO TABS
0.5000 mg | ORAL_TABLET | ORAL | Status: DC | PRN
  Filled 2019-03-23: qty 1

## 2019-03-23 MED ORDER — HALOPERIDOL LACTATE 2 MG/ML PO CONC
0.5000 mg | ORAL | Status: DC | PRN
  Filled 2019-03-23: qty 0.3

## 2019-03-23 MED ORDER — MORPHINE SULFATE (CONCENTRATE) 10 MG/0.5ML PO SOLN
10.0000 mg | Freq: Four times a day (QID) | ORAL | Status: DC
  Administered 2019-03-23 – 2019-03-25 (×8): 10 mg via ORAL
  Filled 2019-03-23 (×9): qty 0.5

## 2019-03-23 MED ORDER — BOOST / RESOURCE BREEZE PO LIQD CUSTOM
1.0000 | Freq: Three times a day (TID) | ORAL | Status: DC
  Administered 2019-03-23 – 2019-03-25 (×8): 1 via ORAL

## 2019-03-23 MED ORDER — HALOPERIDOL LACTATE 5 MG/ML IJ SOLN: 0.5000 mg | INTRAMUSCULAR | Status: DC | PRN

## 2019-03-23 MED ORDER — BISACODYL 5 MG PO TBEC: 5.0000 mg | DELAYED_RELEASE_TABLET | Freq: Every day | ORAL | Status: DC | PRN

## 2019-03-23 MED ORDER — SUCRALFATE 1 GM/10ML PO SUSP
1.0000 g | Freq: Two times a day (BID) | ORAL | Status: DC
  Administered 2019-03-23 – 2019-03-25 (×4): 1 g via ORAL
  Filled 2019-03-23 (×4): qty 10

## 2019-03-23 MED ORDER — POLYETHYLENE GLYCOL 3350 17 G PO PACK
17.0000 g | PACK | Freq: Every day | ORAL | Status: DC | PRN
  Filled 2019-03-23: qty 1

## 2019-03-23 MED ORDER — MORPHINE SULFATE (CONCENTRATE) 10 MG/0.5ML PO SOLN: 10.0000 mg | ORAL | Status: DC | PRN

## 2019-03-23 MED ORDER — OXYCODONE HCL 5 MG/5ML PO SOLN
5.0000 mg | Freq: Three times a day (TID) | ORAL | Status: DC
  Administered 2019-03-23: 5 mg via ORAL
  Filled 2019-03-23: qty 5

## 2019-03-23 MED ORDER — MORPHINE SULFATE (PF) 2 MG/ML IV SOLN
2.0000 mg | INTRAVENOUS | Status: DC | PRN
  Administered 2019-03-23: 2 mg via INTRAVENOUS
  Filled 2019-03-23: qty 1

## 2019-03-23 MED ORDER — PANTOPRAZOLE SODIUM 40 MG PO PACK: 40.0000 mg | PACK | Freq: Every day | ORAL | Status: DC

## 2019-03-23 MED ORDER — PANTOPRAZOLE SODIUM 40 MG PO PACK
40.0000 mg | PACK | Freq: Two times a day (BID) | ORAL | Status: DC
  Administered 2019-03-23 – 2019-03-25 (×5): 40 mg via ORAL
  Filled 2019-03-23 (×7): qty 20

## 2019-03-23 MED ORDER — POLYVINYL ALCOHOL 1.4 % OP SOLN
1.0000 [drp] | Freq: Four times a day (QID) | OPHTHALMIC | Status: DC | PRN
  Filled 2019-03-23: qty 15

## 2019-03-23 NOTE — Progress Notes (Signed)
Visited with patient in response to Spiritual Consult. Patient is at peace with diagnosis and leans heavily on faith in God for that peace. Will continue to provide spiritual care as needed.   Rev. [place].

## 2019-03-23 NOTE — Progress Notes (Signed)
   Subjective: Patient was seen and evaluated at bedside on morning rounds. No acute events overnight. Endorses some abdominal and lower chest pain that has been chronic but not well controlled. No other complaint and he is watching TV conformably.   Objective:  Vital signs in last 24 hours: Vitals:   03/22/19 1527 03/22/19 2000 03/23/19 0440 03/23/19 0732  BP: (!) 188/76 (!) 146/85 (!) 143/59 138/71  Pulse: 78 89 82 68  Resp: 14 18 20 14   Temp: 97.7 F (36.5 C) 98 F (36.7 C) (!) 97.5 F (36.4 C) (!) 97.4 F (36.3 C)  TempSrc: Oral Oral Oral Oral  SpO2: 94% 94% 93% 97%  Weight:      Height:      Physical Exam  Constitutional: No distress.  Ill appearing, thin elderly  Pulmonary/Chest: Effort normal. No respiratory distress. He has rales.  Abdominal: Soft. Bowel sounds are normal. He exhibits no distension. Mild abdominal tenderness.  Neurological: He is alert.  Hard of hearing Oriented at [place]  Vitals reviewed.  Assessment/Plan:  Active Problems:   Esophageal mass   Goals of care, counseling/discussion  84 y.o. male with PMHx of CHF, HTN, dysphagia, Hx of asbest exposure presented with progressive dysphagia.  Esophageal tumor, liver masses with lung nodules and pleural plaque: Presumed malignancy with metastasis, liver mass, pleural plaques,  pulmonary nodes. Primary uncertain. Bx pending. Patients wants agressive care. EGD 10/16: partially obstructing, malignant esophageal tumor. Biopsied. Oncology and palliative consulted. Vitals stable (hypertensive yesterday but improved today)  -Hb stabe at 12.8 -Palliative care consulted and will meet with patient and family today -Continue PPI and famotidine -Continue IV f -Avoiding Lovenox for today given risk of bleeding and per post EGD rec -SCDs -Appreciate oncology recommendations -f/u surgical path -Clear liquid diet -Continue NS 32ml/h  Liver mas sess, elevated LFTs: LFt unchanged. Likely 2/2 liver mass -f/u  cmp -C/w IVF  -Monitor CMP  Dispo: Anticipated discharge in approximately 2-3 days Dewayne Hatch, MD 03/23/2019, 8:27 AM Pager: [phone]

## 2019-03-23 NOTE — Consult Note (Signed)
Consultation Note Date: 03/23/2019   Patient Name: Eric Evans  DOB: 04/04/1935  MRN: [id number]  Age / Sex: 84 y.o., male  PCP: Patient, No Pcp Per Referring Physician: Axel Filler, *  Reason for Consultation: Establishing goals of care, Pain control and Psychosocial/spiritual support  HPI/Patient Profile: 84 y.o. male  with past medical history of asbestos exposure, CHF (no documented EF available), and hearing loss who was admitted on 03/21/2019 with abdominal pain and dysphagia.  Imaging revealed a thickened esophagus, large soft tissue masses in the liver as well as bilateral irregular pleural thickening, nodules in the lungs, and an enlarged heterogenous prostate gland.  GI was consulted and performed endoscopy at the family's request on 10/16.  Oncology was consulted.  This appears likely to be metastatic cancer.  At this point patient is too weak for chemotherapy or radiation.    Patient complains of pain and points to the center of his chest.  He states he can sip water but otherwise can not tolerate PO - even chicken broth seems to come back up in his throat.  Clinical Assessment and Goals of Care:  I have reviewed medical records including EPIC notes, labs and imaging, received report from the Dr. Myrtie Hawk and care team, assessed the patient and then met at the bedside along with his wife to discuss diagnosis prognosis, [place], EOL wishes, disposition and options.  I introduced Palliative Medicine as specialized medical care for people living with serious illness. It focuses on providing relief from the symptoms and stress of a serious illness. The goal is to improve quality of life for both the patient and the family.  We discussed a brief life review of the patient.  He was in [employer] and served active duty in [place].  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  He and Jolayne Haines have been married for 50 years.  They have two grown sons. For the last 5+ years Antavius has been at home in his recliner chair mostly.  Jolayne Haines helps him with ADLs.  Bernabe is a big Trump fan and is hopeful to vote in this election.  As far as functional and nutritional status Jakyren has been very sedentary for many years.  He requires help with ADLs.  At this point he is unable to stand safely on his own and needs a great deal of assistance. He can take only sips of water.    We discussed his current illness and what it means in the larger context of his on-going co-morbidities.  Natural disease trajectory and expectations at EOL were discussed.  The family was very understanding and accepting of the information about Mirza's illness.  They feel strongly that he would rather be comfortable at home than undergo any chemo/radiation or PEG placement.  We talked a lot about using medications to manage his symptoms.  Jolayne Haines feels she will need a good bit of help in the home and  that there will likely come a time with she will be unable to manage him in the home.  Hospice and Palliative Care services outpatient were explained and offered.  Family lives in [place] and would like to accept [facility].  Questions and concerns were addressed.  The family was encouraged to call with questions or concerns.    Primary Decision Maker:  PATIENT    SUMMARY OF RECOMMENDATIONS    1.  After an effective pain management regimen has been established, patient should go home with hospice.  He will need equipment in the home prior to discharge. He will likely need transfer from home to [facility] [place] at some point as I feel his symptoms may be difficult to manage. 2.  Patient has had adverse effects with other opioids in the past, but has done well with morphine.  Will initiate morphine SL today in order to  establish a regimen he can go home on. 3.  Trial sucralfate.  If that is unsatisfactory may try viscous lidocaine. 4.  Monitor bowel movements - he states it has been several days since he last had a bowel movement - may need a suppository. 5.  Change all medications to liquid as he is unable to take pills.   Code Status/[place] Care Planning:  DNR   Symptom Management:  As above  Additional Recommendations (Limitations, Scope, Preferences):  Full Comfort Care, No Artificial Feeding, No Chemotherapy, No Radiation and No Surgical Procedures  Palliative Prophylaxis:   Frequent Pain Assessment  Psycho-social/Spiritual:   Desire for further Chaplaincy support: requested.  Prognosis: weeks to perhaps months.  I'm concerned he will dehydrate relatively quickly at home and likely decline rapidly.    Discharge Planning: Home with Hospice      Primary Diagnoses: Present on Admission: **None**   I have reviewed the medical record, interviewed the patient and family, and examined the patient. The following aspects are pertinent.  Past Medical History:  Diagnosis Date  . CHF (congestive heart failure) ([place])   . Hypertension    Social History   Socioeconomic History  . Marital status: Married    Spouse name: Not on file  . Number of children: 2  . Years of education: Not on file  . Highest education level: Not on file  Occupational History  . Not on file  Social Needs  . Financial resource strain: Not on file  . Food insecurity    Worry: Not on file    Inability: Not on file  . Transportation needs    Medical: Not on file    Non-medical: Not on file  Tobacco Use  . Smoking status: Former Smoker    Packs/day: 0.50    Years: 15.00    Pack years: 7.50    Quit date: 06/07/1983    Years since quitting: 35.8  . Smokeless tobacco: Never Used  Substance and Sexual Activity  . Alcohol use: No  . Drug use: No  . Sexual activity: Not on file  Lifestyle  . Physical  activity    Days per week: Not on file    Minutes per session: Not on file  . Stress: Not on file  Relationships  . Social Herbalist on phone: Not on file    Gets together: Not on file    Attends religious service: Not on file    Active member of club or organization: Not on file    Attends meetings of  clubs or organizations: Not on file    Relationship status: Not on file  Other Topics Concern  . Not on file  Social History Narrative   The patient lives with his wife in [place], [place].    Has not driven since 2158.   Wife has to help with ADLs and has done so for about 15 years.   Family History  Problem Relation Age of Onset  . Cancer Maternal Uncle   . Liver cancer Half-Brother    Scheduled Meds: . mupirocin ointment  1 application Nasal BID  . oxyCODONE  5 mg Oral Q8H  . pantoprazole sodium  40 mg Oral BID  . senna  1 tablet Oral Daily  . sucralfate  1 g Oral BID WC   Continuous Infusions: . sodium chloride 75 mL/hr (03/22/19 2131)   PRN Meds:.acetaminophen **OR** acetaminophen, morphine injection, oxyCODONE No Known Allergies Review of Systems chest pain, back pain, anorexia, dysphagia, constipation  Physical Exam  Thin, frail gentleman, very hard of hearing.  Awake, alert, calm, coherent. CV rrr resp no distress    Vital Signs: BP 138/71 (BP Location: Left Arm)   Pulse 68   Temp (!) 97.4 F (36.3 C) (Oral)   Resp 14   Ht _0  (1.626 m)   Wt 72.6 kg   SpO2 97%   BMI 27.46 kg/m  Pain Scale: Faces   Pain Score: 1    SpO2: SpO2: 97 % O2 Device:SpO2: 97 % O2 Flow Rate: .O2 Flow Rate (L/min): 2 L/min  IO: Intake/output summary:   Intake/Output Summary (Last 24 hours) at 03/23/2019 0939 Last data filed at 03/23/2019 0500 Gross per 24 hour  Intake 220 ml  Output 850 ml  Net -630 ml    LBM: Last BM Date: 03/21/19 Baseline Weight: Weight: 72.6 kg Most recent weight: Weight: 72.6 kg     Palliative Assessment/Data:  20%     Time  In: 9:50 Time Out: 11:00 Time Total: 70 min. Visit consisted of counseling and education dealing with the complex and emotionally intense issues surrounding the need for palliative care and symptom management in the setting of serious and potentially life-threatening illness. Greater than 50%  of this time was spent counseling and coordinating care related to the above assessment and plan.  Signed by: Florentina Jenny, PA-C Palliative Medicine Pager: [phone]  Please contact Palliative Medicine Team phone at [phone] for questions and concerns.  For individual provider: See Shea Evans

## 2019-03-23 NOTE — Plan of Care (Signed)
  Problem: Pain Managment: Goal: General experience of comfort will improve Outcome: Progressing   Problem: Safety: Goal: Ability to remain free from injury will improve Outcome: Progressing   Problem: Skin Integrity: Goal: Risk for impaired skin integrity will decrease Outcome: Progressing   

## 2019-03-24 ENCOUNTER — Encounter (HOSPITAL_COMMUNITY): Payer: Self-pay | Admitting: Gastroenterology

## 2019-03-24 MED ORDER — FUROSEMIDE 10 MG/ML IJ SOLN
40.0000 mg | Freq: Once | INTRAMUSCULAR | Status: AC
  Administered 2019-03-24: 40 mg via INTRAVENOUS
  Filled 2019-03-24: qty 4

## 2019-03-24 MED ORDER — TAMSULOSIN HCL 0.4 MG PO CAPS: 0.4000 mg | ORAL_CAPSULE | Freq: Every day | ORAL | Status: DC

## 2019-03-24 MED ORDER — POLYETHYLENE GLYCOL 3350 17 G PO PACK
17.0000 g | PACK | Freq: Two times a day (BID) | ORAL | Status: DC
  Administered 2019-03-24 – 2019-03-25 (×3): 17 g via ORAL
  Filled 2019-03-24 (×2): qty 1

## 2019-03-24 NOTE — Progress Notes (Signed)
Daily Progress Note   Patient Name: Eric Evans       Date: 03/24/2019 DOB: August 19, 1934  Age: 84 y.o. MRN#: [id number] Attending Physician: Axel Filler, * Primary Care Physician: Patient, No Pcp Per Admit Date: 03/21/2019  Reason for Consultation/Follow-up: Establishing goals of care, Non pain symptom management, Pain control and Psychosocial/spiritual support  Subjective: Patient denies pain but is somewhat confused.  He is taking sips of clears. He indicates the sucralfate is making it easier for him to swallow clears.  The RN Tech and I reposition him in bed.    Case discussed with RN and Tech.   Spoke with Eric.  She asked several questions about care in the home.  Questions answered.  She asked when he was coming home.  She's waiting to hear from Hospice.  Assessment: Patient mildly confused.  Breathing shallow - bladder scan indicative of urinary retention.  IVF at 80 cc/hour   Patient Profile/HPI:  84 y.o. male  with past medical history of asbestos exposure, CHF (no documented EF available), and hearing loss who was admitted on 03/21/2019 with abdominal pain and dysphagia.  Imaging revealed a thickened esophagus, large soft tissue masses in the liver as well as bilateral irregular pleural thickening, nodules in the lungs, and an enlarged heterogenous prostate gland.  GI was consulted and performed endoscopy at the family's request on 10/16.  Oncology was consulted.  This appears likely to be metastatic cancer.  At this point patient is too weak for chemotherapy or radiation.    Length of Stay: 3  Current Medications: Scheduled Meds:  . feeding supplement  1 Container Oral TID BM  . morphine CONCENTRATE  10 mg Oral Q6H  . mupirocin ointment  1 application Nasal BID   . pantoprazole sodium  40 mg Oral BID  . polyethylene glycol  17 g Oral BID  . sucralfate  1 g Oral BID WC    Continuous Infusions:   PRN Meds: acetaminophen **OR** acetaminophen, antiseptic oral rinse, haloperidol **OR** haloperidol **OR** haloperidol lactate, morphine injection, morphine CONCENTRATE, polyethylene glycol, polyvinyl alcohol  Physical Exam        Thin frail man, HOH, in bed watching fox news on the TV.  Trump hat on his head. CV rrr resp no distress but breaths are shallow and air movement is  poor Abdomen soft, nt, nd   Vital Signs: BP (!) 166/81 (BP Location: Left Arm)   Pulse 81   Temp 98.2 F (36.8 C) (Oral)   Resp 14   Ht 5\' 4"  (1.626 m)   Wt 72.6 kg   SpO2 92%   BMI 27.46 kg/m  SpO2: SpO2: 92 % O2 Device: O2 Device: Room Air O2 Flow Rate: O2 Flow Rate (L/min): 2 L/min  Intake/output summary:   Intake/Output Summary (Last 24 hours) at 03/24/2019 1522 Last data filed at 03/23/2019 2100 Gross per 24 hour  Intake 120 ml  Output 250 ml  Net -130 ml   LBM: Last BM Date: 03/21/19 Baseline Weight: Weight: 72.6 kg Most recent weight: Weight: 72.6 kg       Palliative Assessment/Data: 20%      Patient Active Problem List   Diagnosis Date Noted  . Liver metastasis ([place])   . Palliative care encounter   . Encounter for hospice care discussion   . Comfort measures only status   . Goals of care, counseling/discussion   . Esophageal mass 03/21/2019    Palliative Care Plan    Recommendations/Plan:  IVF stopped.  Bladder scan indicative of urinary retention.  Started flomax and gave 1 dose of lasix after foley cath placement  (Thank you Marissa, RN)  Discussed medications/confusion with Eric Evans.  She insists that she would rather have him mildly confused than in pain.  Home with Hospice services of [facility] when equipment is in place.   Have communicated with case management and Hospice.  Goals of Care and Additional  Recommendations:  Limitations on Scope of Treatment: Full Comfort Care  Code Status:  DNR  Prognosis:   < 6 weeks patient has little PO intake.  Sips & chips.  Aggressive metastatic cancer.   Discharge Planning:  Home with Hospice.   Family requests [facility] as they are familiar with the facility.  Care plan was discussed with Wife, RN, Tech, Case Manager and [place].  Thank you for allowing the Palliative Medicine Team to assist in the care of this patient.  Total time spent:   35 min.     Greater than 50%  of this time was spent counseling and coordinating care related to the above assessment and plan.  Florentina Jenny, PA-C Palliative Medicine  Please contact Palliative MedicineTeam phone at [phone] for questions and concerns between 7 am - 7 pm.   Please see AMION for individual provider pager numbers.

## 2019-03-24 NOTE — Plan of Care (Signed)

## 2019-03-24 NOTE — Progress Notes (Signed)
   S:  Patient reports doing well today. Drinking some fluids without obstruction symptoms. Pain is well controlled.   O:   Vitals:   03/24/19 0856 03/24/19 1546  BP: (!) 166/81 (!) 171/92  Pulse: 81 87  Resp: 14 (!) 22  Temp: 98.2 F (36.8 C) (!) 97.5 F (36.4 C)  SpO2: 92% 95%   Gen: comfortable appearing Ext: warm, well perfused Neuro: alert, conversational, full strength throughout Psych: A little withdrawn, watching TV during our visit, not depressed or anxious appearing  A/P:    Active Problems:   Esophageal mass   Goals of care, counseling/discussion   Liver metastasis ([place])   Palliative care encounter   Encounter for hospice care discussion   Comfort measures only status  Hospital day #3 for this 84 year old man admitted with symptoms of obstructive esophageal mass and found to have widely metastatic cancer. Biopsy of the esophageal mass is pending. Greatly appreciate GI consultation. He is tolerating liquid diet well. He had goals of care discussion with palliative service and has elected for home hospice services. We are working to arrange home equipment, will need hospital bed. Can be discharged when that is arranged.   Axel Filler, MD 03/24/2019, 8:18 PM

## 2019-03-24 NOTE — TOC Transition Note (Signed)
Transition of Care [facility]) - CM/SW Discharge Note   Patient Details  Name: Eric Evans MRN: [id number] Date of Birth: 17-Jan-1935  Transition of Care [facility]) CM/SW Contact:  Claudie Leach, RN Phone Number: 03/24/2019, 4:32 PM   Clinical Narrative:    Pt to d/c home with wife and home hospice.  Wife states she would like to have a hospital bed and wheelchair.  Her 2 sons are able to assist in patient's care also.  Wife requests [place] so that patient may go to the facility on [address] when needed.  Wife states patient wants to go home via private vehicle so that he can vote on the way. She states there are friends and neighbors who can help get the patient into the house if they have a wheelchair available.    Referral accepted by Yetta Glassman, RN of [facility] [facility].  Discharge will be planned for tomorrow.     Final next level of care: Home w Hospice Care Barriers to Discharge: No Barriers Identified   Patient Goals and CMS Choice Patient states their goals for this hospitalization and ongoing recovery are:: to go vote CMS Medicare.gov Compare Post Acute Care list provided to:: Patient Represenative (must comment)(wife) Choice offered to / list presented to : Spouse   Discharge Plan and Services                DME Arranged: Hospital bed, Wheelchair manual, Oxygen [facility]ME [facility] ([facility])         [place]: [place] Date [place]: 03/24/19 Time [place]: [phone] Representative spoke with at [place]: Diane   Messages left for wife on cell phone earlier in the day were not returned.  Wife requests to be contacted first on home phone. If she is out, cell is only option.

## 2019-03-24 NOTE — Progress Notes (Signed)
Esophageal biopsies still pending.  I see where the patient and his family have opted for comfort measures rather than treatment, including PEG placement.    I assume this also means esophageal stent placement (which would be problematic in this patient anyway, since the lesion is distal and the stent would have to cross the GE junction, thereby leading to significant reflux).  I went by the patient's room to discuss this with the patient in person.  However, he indicates that he would like me to discuss this with his wife, since he has trouble hearing.  I tried reaching the patient's wife on both her home phone and cell phone, but got no answer.  Plan:  1.  We will be in touch with the patient concerning his esophageal biopsies, when they are available  2.  We will see if we can reach the patient's wife in the next day or so prior to discharge to confirm my impression, from chart review, that no further interventions from [place] will be needed.  Please feel free to call us at any time to discuss the patient's case.  Cleotis Nipper, M.D. Pager [phone] If no answer or after 5 PM call [phone]

## 2019-03-25 DIAGNOSIS — R338 Other retention of urine: Secondary | ICD-10-CM

## 2019-03-25 DIAGNOSIS — Z960 Presence of urogenital implants: Secondary | ICD-10-CM

## 2019-03-25 DIAGNOSIS — C787 Secondary malignant neoplasm of liver and intrahepatic bile duct: Secondary | ICD-10-CM

## 2019-03-25 DIAGNOSIS — C159 Malignant neoplasm of esophagus, unspecified: Secondary | ICD-10-CM

## 2019-03-25 MED ORDER — SUCRALFATE 1 GM/10ML PO SUSP: 1.0000 g | Freq: Two times a day (BID) | ORAL | 0 refills | Status: AC

## 2019-03-25 MED ORDER — MORPHINE SULFATE (CONCENTRATE) 10 MG/0.5ML PO SOLN: 10.0000 mg | Freq: Four times a day (QID) | ORAL | 0 refills | Status: AC

## 2019-03-25 NOTE — Progress Notes (Signed)
Discharge instructions addressed; Pt going home with hospice care; foley catheter still in placed with doctor's approval; Pt will be going home with wife today.

## 2019-03-25 NOTE — Care Management Important Message (Signed)
Important Message  Patient Details  Name: Eric Evans MRN: [id number] Date of Birth: 1935-04-07   Medicare Important Message Given:  Yes     Memory Argue 03/25/2019, 4:39 PM

## 2019-03-25 NOTE — Progress Notes (Signed)
Internal Medicine Attending:   I saw and examined the patient. I reviewed the resident's note and I agree with the resident's findings and plan as documented in the resident's note.  Patient states that he feels okay today and is able to tolerate liquid diet.  States that he wants to go home today.  Patient was admitted to the hospital with new diagnosis of likely metastatic esophageal cancer.  GI and oncology follow-up and recommendations appreciated.  Patient and family refusing aggressive measures and want to go home with home hospice.  Palliative care follow-up and recommendations appreciated.  Patient stable for DC home with home hospice today.  No further work-up at this time.

## 2019-03-25 NOTE — Discharge Summary (Signed)
Name: Eric Evans MRN: [id number] DOB: 1934-12-20 84 y.o. PCP: Patient, No Pcp Per  Date of Admission: 03/21/2019  8:05 AM Date of Discharge: 03/25/2019 Attending Physician: Aldine Contes, MD  Discharge Diagnosis: 1. Esophageal Malignancy with metastasis  2. Prostate enlargement with urinary retention  Discharge Medications: Allergies as of 03/25/2019   No Known Allergies     Medication List    STOP taking these medications   albuterol 108 (90 Base) MCG/ACT inhaler Commonly known as: VENTOLIN HFA   famotidine 20 MG tablet Commonly known as: PEPCID   furosemide 20 MG tablet Commonly known as: LASIX   omeprazole 20 MG capsule Commonly known as: PRILOSEC     TAKE these medications   morphine CONCENTRATE 10 MG/0.5ML Soln concentrated solution Take 0.5 mLs (10 mg total) by mouth every 6 (six) hours for 7 days.   sucralfate 1 GM/10ML suspension Commonly known as: CARAFATE Take 10 mLs (1 g total) by mouth 2 (two) times daily with a meal.       Disposition and follow-up:   Eric Evans was discharged from [facility] in [place] condition.  At the hospital follow up visit please address:  1.  Esophageal Malignancy with metastasis: New diagnosis after weeks of dysphagia and decreased PO intake. He and his family decided to go forward with hospice care. Palliative arranged home hospice. Please ensure patient is comfortable and has adequate pain control.   2. Prostate enlargement with urinary retention: Discharged with foley catheter.  2.  Labs / imaging needed at time of follow-up: None   3.  Pending labs/ test needing follow-up: esophageal mass biopsy  Follow-up Appointments:   Hospital Course by problem list: 1. Esophageal Malignancy with metastasis  Patient presented to the ED with c/o several weeks of dysphagia and inability to eat that started with solids and progressed to liquids. Imaging obtained in the ED showed significant  fungating esophageal mass with additional masses noted in the lung, pleura and liver. GI was consulted for EGD with biopsy per the family's wishes. Findings were significant for a ulcerated, fungating mass with stigmata of recent bleeding. This was consistent with positive fecal occult test in the ED with history of black stool. Oncology and palliative were consulted. Due to poor performance overall in the recent years, Oncology discussed with family that patient would likely not tolerate systemic treatment. Palliative care dicussed goals of care with family, including sons and wife, and it was decided to move forward with home hospice and eventual transition to inpatient hospice when his wife is no longer able to care for him at home. Palliative care ordered morphine to ensure adequate pain control and Carafate for painful swallowing. Biopsy results are still pending.   2. Prostate enlargement with urinary retention Chronic problem that had not previously been worked up. Possibly a second primary vs metastasis. Due to urinary retention and difficulty ambulating, patient was provided foley catheter and discharged with it for home.   Discharge Vitals:   BP (!) 155/79 (BP Location: Left Arm)   Pulse 88   Temp 98.6 F (37 C) (Oral)   Resp 15   Ht 5\' 4"  (1.626 m)   Wt 72.6 kg   SpO2 92%   BMI 27.46 kg/m   Pertinent Labs, Studies, and Procedures:  CBC Latest Ref Rng & Units 03/22/2019 03/22/2019 03/21/2019  WBC 4.0 - 10.5 K/uL 8.7 10.0 11.9(H)  Hemoglobin 13.0 - 17.0 g/dL 12.8(L) 12.9(L) 14.8  Hematocrit 39.0 - 52.0 %  37.0(L) 39.2 43.0  Platelets 150 - 400 K/uL 327 400 401(H)   BMP Latest Ref Rng & Units 03/22/2019 03/21/2019 03/15/2019  Glucose 70 - 99 mg/dL 101(H) 112(H) 117(H)  BUN 8 - 23 mg/dL 11 13 12   Creatinine 0.61 - 1.24 mg/dL 0.92 1.14 0.95  Sodium 135 - 145 mmol/L 137 139 141  Potassium 3.5 - 5.1 mmol/L 3.5 3.3(L) 3.4(L)  Chloride 98 - 111 mmol/L 101 98 100  CO2 22 - 32 mmol/L 22  20(L) 25  Calcium 8.9 - 10.3 mg/dL 8.6(L) 9.1 9.3   CT Abd/Pelvis: (03/21/2019) 1. Marked dilation of the distal esophagus with irregular fungating soft tissue thickening along the anterior wall of the esophagus, partially visualized due to collimation. Sub pathologic by CT criteria paraesophageal lymph nodes measuring up to 6 mm in short axis, likely malignant. 2. Several, some confluent soft tissue masses throughout the liver, with the largest in the dome of the liver measuring 7.2 x 6.2 cm. These findings are highly suspicious for metastatic disease. 3. Bilateral irregular pleural thickening with calcified pleural plaques in the lung bases. Innumerable pleural/subpleural soft tissue nodules along the sub pulmonic peri diaphragmatic bilateral lower lobe pleura. These may represent pleural-based metastases, or primary tumors. 4. Heterogeneous enlarged prostate gland. The base of the urinary bladder is compressed by markedly enlarged prostate gland. Please correlate to serum PSA values  EGD: (03/22/2019) - Partially obstructing, malignant esophageal tumor was found in the middle third of the esophagus and in the lower third of the esophagus. Biopsied. - Red blood in the gastric body.  Discharge Instructions: Discharge Instructions    Discharge instructions   Complete by: As directed    Thank your for allowing [place] to take part in your care.      Signed:  Dr. Jose Persia Internal Medicine PGY-1  Pager: [phone] 03/25/2019, 1:36 PM

## 2019-03-25 NOTE — Plan of Care (Signed)

## 2019-03-25 NOTE — Progress Notes (Signed)
Eric Evans   DOB:[birth year]   [id number]    Assessment & Plan:   Presumed metastatic esophageal cancer -Patient underwent EGD 03/22/2019, which showed a large fungating, ulcerating mass with evidence of bleeding in the middle and lower thirds of the esophagus, consistent with primary esophageal cancer.  The pathology from the biopsy is still pending. -Due to the patient's poor performance status, advanced age, and the multiple other comorbidities, palliative care was consulted and recommended hospice -After lengthy discussions, patient's family was in agreement with the plan, and he is anticipated to go home with hospice today -I spoke with the patient's wife over the phone, and informed her that it was very appropriate and reasonable to proceed with hospice, given the patient's inability to tolerate any systemic therapy  Goals of care discussion -In light of the extensive metastatic disease to the liver, the goal of treatment will be for only palliative intent; however, given the patient's advanced age, multiple comorbidities, and poor performance status, potential harms/side effects from treatment will likely greatly outweigh any benefits -Therefore, I agree with proceeding with home hospice  Thank you for the opportunity participate in Eric Evans' care.  Please do not hesitate to contact us with questions.  Tish Men, MD 03/25/2019  9:46 AM  Subjective:  Eric Evans reports that he feels okay this morning, and denies any abdominal pain.  He has severe hearing difficulty, and therefore the history was very limited.  I spoke with the patient's spouse over the phone, who indicated that she and her family were at peace with the decision to proceed with home hospice.  Her sons will help her at home.  She expressed appreciation for the care that Eric Evans has received.   ROS: Constitutional: ( - ) fevers, ( - )  chills , ( - ) night sweats Ears, nose, mouth, throat, and face: ( - )  mucositis, ( - ) sore throat Respiratory: ( - ) cough, ( - ) dyspnea, ( - ) wheezes Cardiovascular: ( - ) palpitation, ( - ) chest discomfort, ( - ) lower extremity swelling Gastrointestinal:  ( - ) nausea, ( - ) heartburn, ( - ) change in bowel habits Skin: ( - ) abnormal skin rashes Behavioral/Psych: ( - ) mood change, ( - ) new changes  All other systems were reviewed with the patient and are negative.  Objective:  Vitals:   03/25/19 0450 03/25/19 0738  BP: (!) 154/73 (!) 155/79  Pulse: 83 88  Resp: 20 15  Temp: 98.5 F (36.9 C) 98.6 F (37 C)  SpO2: 92% 92%     Intake/Output Summary (Last 24 hours) at 03/25/2019 0946 Last data filed at 03/25/2019 0500 Gross per 24 hour  Intake -  Output 3675 ml  Net -3675 ml    GENERAL: alert, no distress and comfortable SKIN: skin color, texture, turgor are normal, no rashes or significant lesions EYES: conjunctiva are pink and non-injected, sclera clear OROPHARYNX: no exudate, no erythema; lips, buccal mucosa, and tongue normal  NECK: supple, non-tender LUNGS: clear to auscultation with normal breathing effort HEART: regular rate & rhythm and no murmurs and no lower extremity edema ABDOMEN: soft, non-tender, non-distended, normal bowel sounds PSYCH: alert & oriented x 3, fluent speech NEURO: no focal motor/sensory deficits   Labs:  Lab Results  Component Value Date   WBC 8.7 03/22/2019   HGB 12.8 (L) 03/22/2019   HCT 37.0 (L) 03/22/2019   MCV 85.1 03/22/2019   PLT 327 03/22/2019  NEUTROABS 9.7 (H) 03/21/2019    Lab Results  Component Value Date   NA 137 03/22/2019   K 3.5 03/22/2019   CL 101 03/22/2019   CO2 22 03/22/2019    Studies:  No results found.

## 2019-03-25 NOTE — Progress Notes (Signed)
Responded to Spiritual Directive. Eric Evans is scheduled to go home today. Found Eric Evans in his bed with his wife at bedside.  Eric Evans very hard of hearing and moving in and out of consciousness from pain meds.  Initiated relationship of care and support for Eric Evans. We addressed some of her fears, she spoke of her support system for caring for her husband at home and Eric Evans being ready to go home to be with Jesus.  We had prayer together.   De Burrs Chaplain Resident Pager:  [phone]

## 2019-03-25 NOTE — Progress Notes (Signed)
[facility]:  [employer] [facility]  Pt has been accepted and in agreement with hospice services at home.   The pt wife will take him home by car. They will be going home.  Equipment is to be delivered this am. The pt's son will be receiving equipment when they call to deliver.   We will follow up when pt gets home.  Webb Silversmith RN [phone]

## 2019-03-25 NOTE — Progress Notes (Signed)
   Subjective:   No acute complaints today. He states his swallowing is a bit better and he is looking forward to going home. All questions and concerns addressed.   Objective:  Vital signs in last 24 hours: Vitals:   03/24/19 0856 03/24/19 1546 03/24/19 2203 03/25/19 0450  BP: (!) 166/81 (!) 171/92 (!) 153/83 (!) 154/73  Pulse: 81 87 85 83  Resp: 14 (!) 22 20 20   Temp: 98.2 F (36.8 C) (!) 97.5 F (36.4 C) 98.8 F (37.1 C) 98.5 F (36.9 C)  TempSrc: Oral Oral Oral Oral  SpO2: 92% 95% 92% 92%  Weight:      Height:        Physical Exam Vitals signs and nursing note reviewed.  Constitutional:      General: He is not in acute distress. Cardiovascular:     Rate and Rhythm: Normal rate and regular rhythm.  Pulmonary:     Effort: Pulmonary effort is normal. No respiratory distress.  Abdominal:     General: Bowel sounds are normal.  Skin:    General: Skin is warm and dry.  Neurological:     Mental Status: He is alert. He is disoriented.    Assessment/Plan:  Active Problems:   Esophageal mass   Goals of care, counseling/discussion   Liver metastasis ([place])   Palliative care encounter   Encounter for hospice care discussion   Comfort measures only status  #Esophageal Tumor: #Liver Metastasis: # Multiple Pleural/Subpleural nodules:  Extremely concerning for malignancy given widespread involvement, large size and irregular shape. With the CT results, it seems the esophageal mass may be the primary with metastasis to the liver. GI was consulted yesterday with EGD with biopsy planned for today. The results were indicative of a extremely large (14cm long) fungating ulcerated mass, 2/3 circumferential but no completely occluding as they were able to scope the duodenum and gastric body. Palliative and oncology were both consulted and on board. Patient has decided to go with hospice care.   Discharge planned for today.   - GI, Palliative and oncology are on board and we  appreciate their recommendations  - Morphine on discharge as palliative had ordered while admitted  # Prostate Enlargement: # Hematuria CT notable for heterogeneous prostate measuring 6.4 x 5.9 cm. He is having urinary retention, so foley was placed.   - Continue foley catheter at home, management per hospice.    Dispo: Anticipated discharge today with home hospice.   Dr. Jose Persia Internal Medicine PGY-1  Pager: [phone] 03/25/2019, 6:55 AM

## 2019-03-25 NOTE — Progress Notes (Signed)
[facility] PROGRESS NOTE Subjective Talk with patient and wife.  They have made arrangements with hospice and he will be going home today under hospice care.  His wife will be driving him in the car and she states they will go by the early voting site so that he can vote.  He denies any pain says he really wants to vote because he is not sure he will be able to the on November 3.  Objective: Vital signs in last 24 hours: Temp:  [97.5 F (36.4 C)-98.8 F (37.1 C)] 98.6 F (37 C) (10/19 0738) Pulse Rate:  [83-88] 88 (10/19 0738) Resp:  [15-22] 15 (10/19 0738) BP: (153-171)/(73-92) 155/79 (10/19 0738) SpO2:  [92 %-95 %] 92 % (10/19 0738) Last BM Date: 03/21/19  Intake/Output from previous day: 10/18 0701 - 10/19 0700 In: -  Out: 3675 [Urine:3675] Intake/Output this shift: No intake/output data recorded.  PE: General--patient alert and responsive denies pain   Lab Results: Recent Labs    03/22/19 1630  WBC 8.7  HGB 12.8*  HCT 37.0*  PLT 327   BMET No results for input(s): NA, K, CL, CO2, CREATININE in the last 72 hours. LFT No results for input(s): PROT, AST, ALT, ALKPHOS, BILITOT, BILIDIR, IBILI in the last 72 hours. PT/INR No results for input(s): LABPROT, INR in the last 72 hours. PANCREAS No results for input(s): LIPASE in the last 72 hours.       Studies/Results: No results found.  Medications: I have reviewed the patient's current medications.  Assessment:   1.  Metastatic esophageal cancer.  Path is still pending but endoscopic picture and CT scan essentially diagnostic.  Agree with hospice care.   Plan: Discussed possible intervention such as stent etc. with wife.  Patient and family do not wish any invasive procedures to prolong his life.  Agree with hospice care.   Nancy Fetter 03/25/2019, 9:38 AM  This note was created using voice recognition software. Minor errors may Have occurred unintentionally.  Pager: [phone] If no  answer or after hours call [phone]

## 2019-03-26 LAB — SURGICAL PATHOLOGY

## 2019-04-07 DEATH — deceased

## 2020-10-07 IMAGING — CT CT ABD-PELV W/ CM
2 of 5 series · 15 of 46 positions shown, 17 images · IV contrast (Omni 300)
Comparison: January 09, 2011

CLINICAL DATA: Right-sided abdominal pain. Area of palpable concern
along the right chest wall.

EXAM:
CT ABDOMEN AND PELVIS WITH CONTRAST
TECHNIQUE: Multidetector CT imaging of the abdomen and pelvis was performed
using the standard protocol following bolus administration of
intravenous contrast.
CONTRAST:  100mL OMNIPAQUE IOHEXOL 300 MG/ML  SOLN

[Series 3: a/p w/ 5mm · axial · 0.78mm/px · z∈[+913,+1328]mm · 12 of 93 slices shown, 14 images]
[im 5/93  soft-tissue]
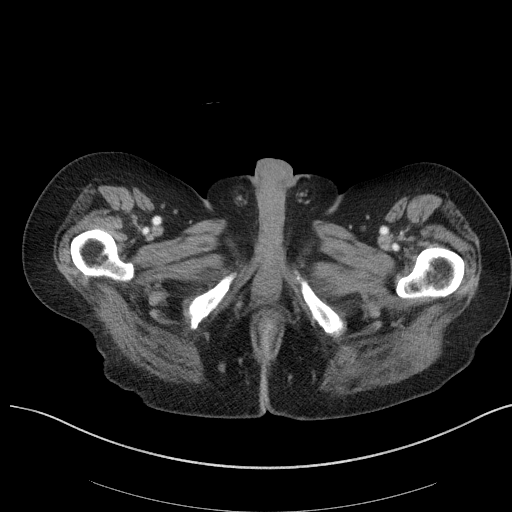
[im 5/93  bone]
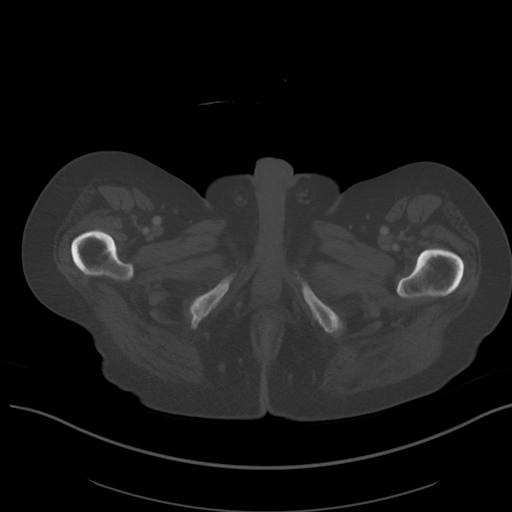
[im 14/93  soft-tissue]
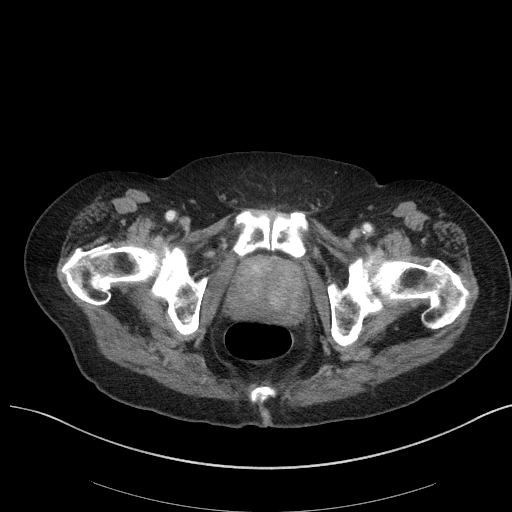
[im 19/93  soft-tissue]
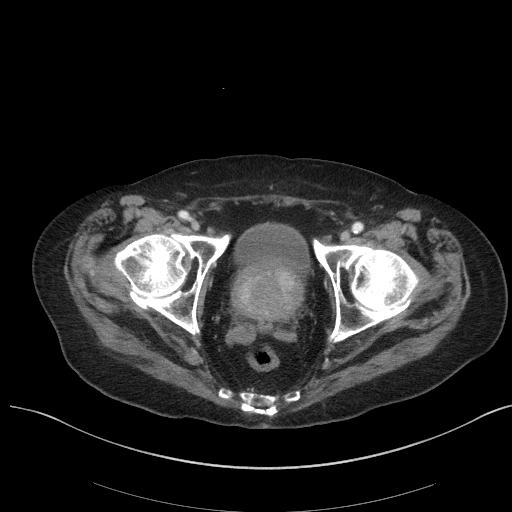
[im 28/93  soft-tissue]
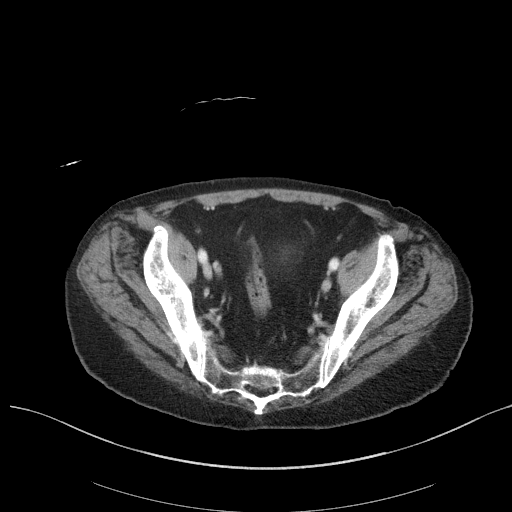
[im 37/93  soft-tissue]
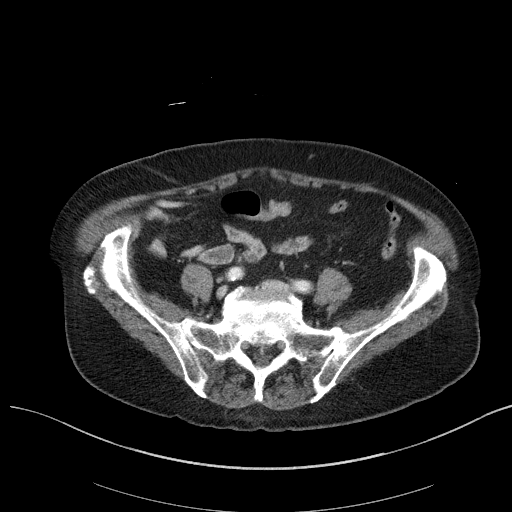
[im 42/93  soft-tissue]
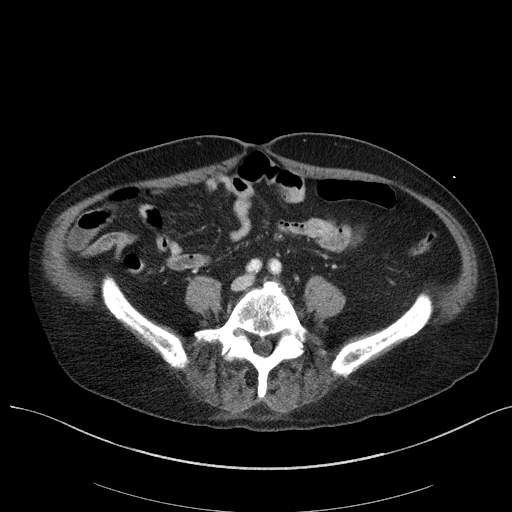
[im 51/93  soft-tissue]
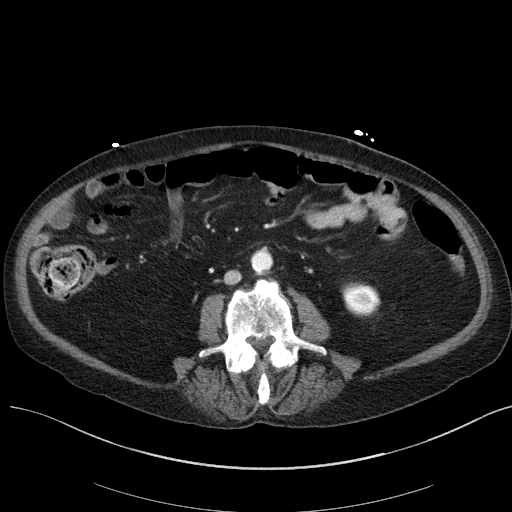
[im 56/93  soft-tissue]
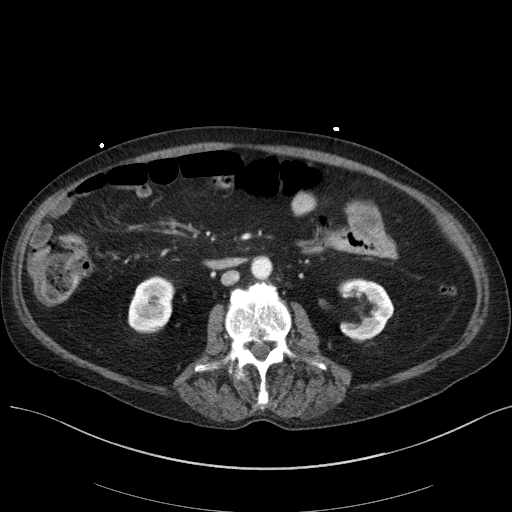
[im 65/93  soft-tissue]
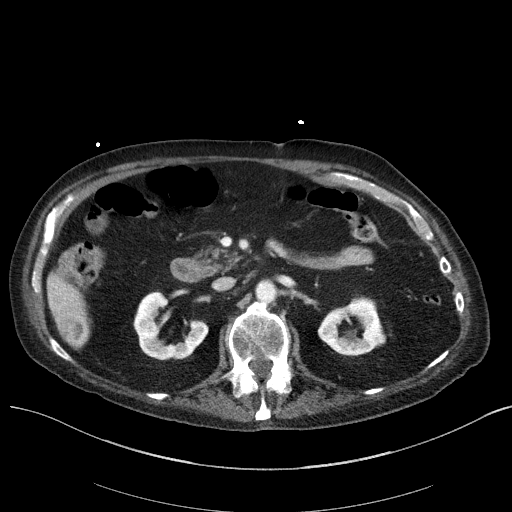
[im 65/93  bone]
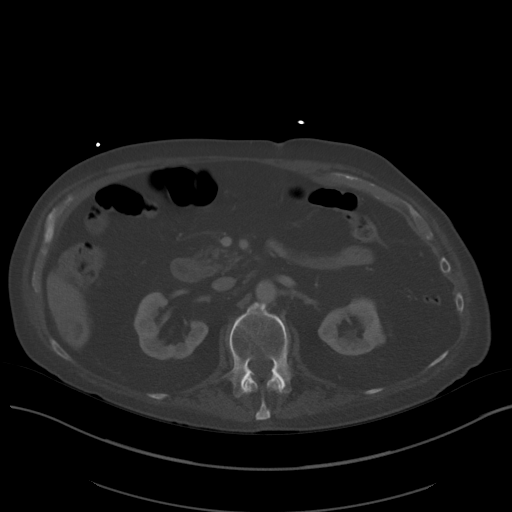
[im 74/93  soft-tissue]
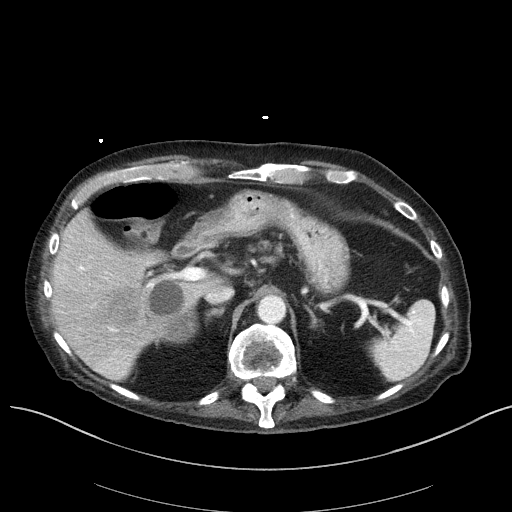
[im 79/93  soft-tissue]
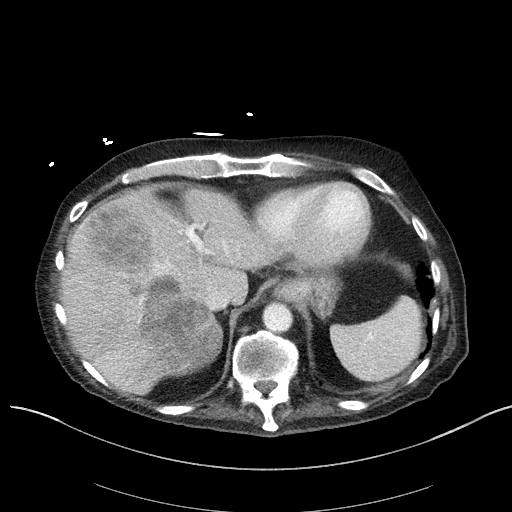
[im 88/93  soft-tissue]
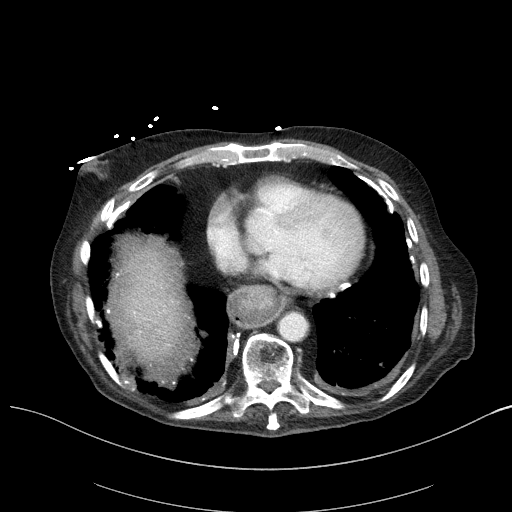

[Series 6: a/p w/ cor · coronal · 0.86mm/px · 3 of 149 slices shown]
[im 50/149  soft-tissue]
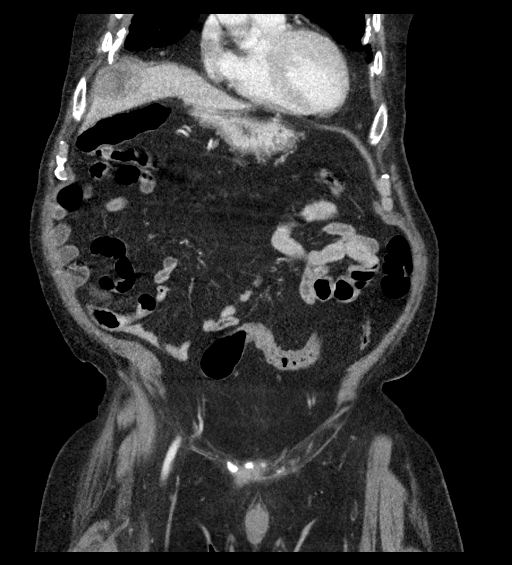
[im 66/149  soft-tissue]
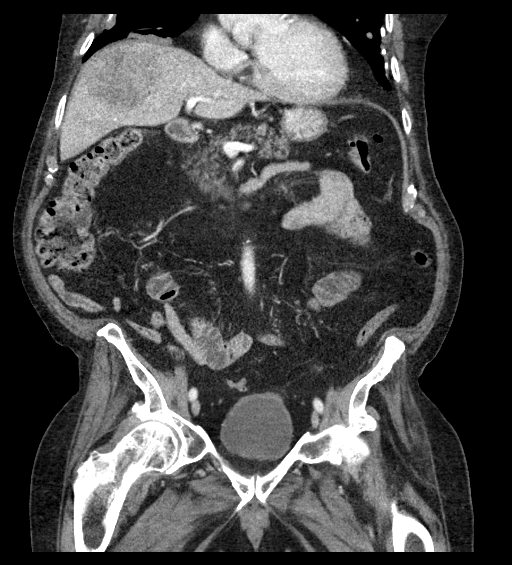
[im 83/149  soft-tissue]
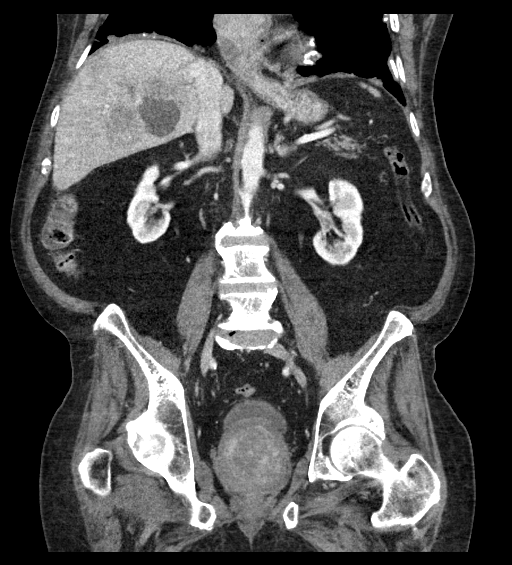

[15 of 46 positions shown; findings below may reference images not displayed]

FINDINGS: Lower chest: Bilateral irregular pleural thickening with calcified
plaques in the lung bases. Innumerable pleural/subpleural soft
tissue nodules along the sub pulmonic peri diaphragmatic bilateral
lower lobe pleura. 7 mm left middle lobe pulmonary nodule. Partially
visualized 1.4 cm right lower lobe peripheral pulmonary nodule. 9 mm
left lower lobe peripheral pulmonary nodule and an additional 7 mm
left lower lobe peripheral pulmonary nodule.

Marked dilation of the distal esophagus with irregular fungating
soft tissue thickening along the anterior wall of the esophagus,
partially visualized due to collimation. Sub pathologic by CT
criteria paraesophageal lymph nodes measuring up to 6 mm in short
axis, likely malignant.

Mildly enlarged heart. No pericardial effusion.

Gynecomastia.

Hepatobiliary: Several, some confluent soft tissue masses throughout
the liver, with the largest in the dome of the liver measuring 7.2 x
6.2 cm. Pre-existing benign cyst in the inferior right lobe of the
liver.

Pancreas: Fatty replacement of the pancreas. No pancreatic ductal
dilation.

Spleen: Splenic granulomata.

Adrenals/Urinary Tract: Adrenal glands are unremarkable. Kidneys are
without renal calculi, focal lesion, or hydronephrosis. Exophytic
right lower pole renal cyst measures 1.5 cm. The base of the urinary
bladder is compressed by markedly enlarged prostate gland.

Stomach/Bowel: Stomach is within normal limits. Appendix appears
normal. No evidence of bowel wall thickening, distention, or
inflammatory changes.

Vascular/Lymphatic: Aortic atherosclerosis. No enlarged abdominal or
pelvic lymph nodes. Nonspecific fatty retroperitoneal lymph nodes in
the upper abdomen.

Reproductive: Heterogeneous enlarged prostate gland measures 6.4 x
5.9 cm.

Other: No abdominal wall hernia or abnormality. No abdominopelvic
ascites.

Musculoskeletal: No acute or significant osseous findings. Mostly
stable lytic lesion within T10 vertebral body. Its stability since
8528 supports benign etiology.
IMPRESSION: 1. Marked dilation of the distal esophagus with irregular fungating
soft tissue thickening along the anterior wall of the esophagus,
partially visualized due to collimation. Sub pathologic by CT
criteria paraesophageal lymph nodes measuring up to 6 mm in short
axis, likely malignant.
2. Several, some confluent soft tissue masses throughout the liver,
with the largest in the dome of the liver measuring 7.2 x 6.2 cm.
These findings are highly suspicious for metastatic disease.
3. Bilateral irregular pleural thickening with calcified pleural
plaques in the lung bases. Innumerable pleural/subpleural soft
tissue nodules along the sub pulmonic peri diaphragmatic bilateral
lower lobe pleura. These may represent pleural-based metastases, or
primary tumors.
4. Heterogeneous enlarged prostate gland. The base of the urinary
bladder is compressed by markedly enlarged prostate gland. Please
correlate to serum PSA values.

Aortic Atherosclerosis ([id number].A).

These results were called by telephone at the time of interpretation
acknowledged these results.
